# Patient Record
Sex: Female | Born: 1965 | Race: White | Hispanic: No | Marital: Single | Smoking: Never smoker
Health system: Southern US, Community
[De-identification: ages and names within clinical notes are randomized; demographics above are authoritative.]

## PROBLEM LIST (undated history)

## (undated) DIAGNOSIS — I10 Essential (primary) hypertension: Secondary | ICD-10-CM

## (undated) DIAGNOSIS — N200 Calculus of kidney: Secondary | ICD-10-CM

## (undated) DIAGNOSIS — G43909 Migraine, unspecified, not intractable, without status migrainosus: Secondary | ICD-10-CM

## (undated) DIAGNOSIS — F32A Depression, unspecified: Secondary | ICD-10-CM

## (undated) DIAGNOSIS — N83209 Unspecified ovarian cyst, unspecified side: Secondary | ICD-10-CM

## (undated) DIAGNOSIS — F329 Major depressive disorder, single episode, unspecified: Secondary | ICD-10-CM

## (undated) HISTORY — PX: ENDOMETRIAL ABLATION: SHX621

## (undated) HISTORY — DX: Essential (primary) hypertension: I10

## (undated) HISTORY — DX: Depression, unspecified: F32.A

## (undated) HISTORY — DX: Migraine, unspecified, not intractable, without status migrainosus: G43.909

## (undated) HISTORY — PX: CHOLECYSTECTOMY: SHX55

## (undated) HISTORY — DX: Unspecified ovarian cyst, unspecified side: N83.209

## (undated) HISTORY — PX: BARIATRIC SURGERY: SHX1103

## (undated) HISTORY — DX: Major depressive disorder, single episode, unspecified: F32.9

## (undated) HISTORY — DX: Calculus of kidney: N20.0

## (undated) HISTORY — PX: TUBAL LIGATION: SHX77

## (undated) HISTORY — PX: TONSILLECTOMY: SUR1361

---

## 2004-10-26 ENCOUNTER — Emergency Department: Payer: Self-pay | Admitting: Emergency Medicine

## 2008-12-07 ENCOUNTER — Emergency Department: Payer: Self-pay

## 2008-12-17 ENCOUNTER — Emergency Department: Payer: Self-pay | Admitting: Unknown Physician Specialty

## 2010-03-24 ENCOUNTER — Emergency Department: Payer: Self-pay | Admitting: Emergency Medicine

## 2011-11-13 ENCOUNTER — Ambulatory Visit: Payer: Self-pay | Admitting: Obstetrics and Gynecology

## 2012-11-13 ENCOUNTER — Emergency Department: Payer: Self-pay | Admitting: Emergency Medicine

## 2014-05-07 ENCOUNTER — Ambulatory Visit: Payer: Self-pay | Admitting: Neurology

## 2014-05-10 ENCOUNTER — Ambulatory Visit: Payer: Self-pay | Admitting: Neurology

## 2014-09-21 ENCOUNTER — Emergency Department: Payer: Self-pay | Admitting: Emergency Medicine

## 2014-09-21 LAB — COMPREHENSIVE METABOLIC PANEL
ALBUMIN: 3.7 g/dL (ref 3.4–5.0)
Alkaline Phosphatase: 103 U/L (ref 46–116)
Anion Gap: 7 (ref 7–16)
BUN: 16 mg/dL (ref 7–18)
Bilirubin,Total: 0.3 mg/dL (ref 0.2–1.0)
Calcium, Total: 9.2 mg/dL (ref 8.5–10.1)
Chloride: 107 mmol/L (ref 98–107)
Co2: 27 mmol/L (ref 21–32)
Creatinine: 0.88 mg/dL (ref 0.60–1.30)
EGFR (African American): >60
EGFR (Non-African Amer.): >60
Glucose: 124 mg/dL — ABNORMAL HIGH (ref 65–99)
OSMOLALITY: 284 (ref 275–301)
Potassium: 4.1 mmol/L (ref 3.5–5.1)
SGOT(AST): 30 U/L (ref 15–37)
SGPT (ALT): 27 U/L (ref 14–63)
SODIUM: 141 mmol/L (ref 136–145)
Total Protein: 7.5 g/dL (ref 6.4–8.2)

## 2014-09-21 LAB — URINALYSIS, COMPLETE
BILIRUBIN, UR: NEGATIVE
Bacteria: NONE SEEN
Blood: NEGATIVE
Glucose,UR: NEGATIVE mg/dL (ref 0–75)
KETONE: NEGATIVE
Nitrite: NEGATIVE
PH: 7 (ref 4.5–8.0)
PROTEIN: NEGATIVE
RBC,UR: 1 /HPF (ref 0–5)
Specific Gravity: 1.012 (ref 1.003–1.030)
WBC UR: 2 /HPF (ref 0–5)

## 2014-09-21 LAB — CBC WITH DIFFERENTIAL/PLATELET
Basophil #: 0.1 10*3/uL (ref 0.0–0.1)
Basophil %: 1.2 %
Eosinophil #: 0.3 10*3/uL (ref 0.0–0.7)
Eosinophil %: 2.4 %
HCT: 44.4 % (ref 35.0–47.0)
HGB: 14.8 g/dL (ref 12.0–16.0)
Lymphocyte #: 2.7 10*3/uL (ref 1.0–3.6)
Lymphocyte %: 24.9 %
MCH: 29.8 pg (ref 26.0–34.0)
MCHC: 33.3 g/dL (ref 32.0–36.0)
MCV: 89 fL (ref 80–100)
Monocyte #: 1 x10 3/mm — ABNORMAL HIGH (ref 0.2–0.9)
Monocyte %: 8.8 %
Neutrophil #: 6.9 10*3/uL — ABNORMAL HIGH (ref 1.4–6.5)
Neutrophil %: 62.7 %
PLATELETS: 280 10*3/uL (ref 150–440)
RBC: 4.97 10*6/uL (ref 3.80–5.20)
RDW: 13.5 % (ref 11.5–14.5)
WBC: 11 10*3/uL (ref 3.6–11.0)

## 2014-09-21 LAB — LIPASE, BLOOD: LIPASE: 71 U/L — AB (ref 73–393)

## 2014-10-31 ENCOUNTER — Encounter
Admit: 2014-10-31 | Disposition: A | Discharge status: Home / Self Care | Payer: Self-pay | Attending: Neurology | Admitting: Neurology

## 2015-03-26 ENCOUNTER — Other Ambulatory Visit: Payer: Self-pay | Admitting: Specialist

## 2015-03-29 ENCOUNTER — Ambulatory Visit
Admission: RE | Admit: 2015-03-29 | Discharge: 2015-03-29 | Disposition: A | Discharge status: Home / Self Care | Payer: 59 | Source: Ambulatory Visit | Attending: Specialist | Admitting: Specialist

## 2015-03-29 DIAGNOSIS — Z01818 Encounter for other preprocedural examination: Secondary | ICD-10-CM | POA: Insufficient documentation

## 2017-01-05 ENCOUNTER — Encounter: Payer: Self-pay | Admitting: Obstetrics & Gynecology

## 2017-01-05 ENCOUNTER — Ambulatory Visit (INDEPENDENT_AMBULATORY_CARE_PROVIDER_SITE_OTHER): Payer: 59 | Admitting: Obstetrics & Gynecology

## 2017-01-05 VITALS — BP 122/80 | HR 64 | Ht 62.0 in | Wt 141.0 lb

## 2017-01-05 DIAGNOSIS — Z01419 Encounter for gynecological examination (general) (routine) without abnormal findings: Secondary | ICD-10-CM

## 2017-01-05 DIAGNOSIS — Z1231 Encounter for screening mammogram for malignant neoplasm of breast: Secondary | ICD-10-CM | POA: Diagnosis not present

## 2017-01-05 DIAGNOSIS — R232 Flushing: Secondary | ICD-10-CM | POA: Diagnosis not present

## 2017-01-05 DIAGNOSIS — Z87410 Personal history of cervical dysplasia: Secondary | ICD-10-CM | POA: Diagnosis not present

## 2017-01-05 DIAGNOSIS — Z0000 Encounter for general adult medical examination without abnormal findings: Secondary | ICD-10-CM

## 2017-01-05 DIAGNOSIS — Z1239 Encounter for other screening for malignant neoplasm of breast: Secondary | ICD-10-CM

## 2017-01-05 DIAGNOSIS — Z8742 Personal history of other diseases of the female genital tract: Secondary | ICD-10-CM | POA: Diagnosis not present

## 2017-01-05 DIAGNOSIS — Z1211 Encounter for screening for malignant neoplasm of colon: Secondary | ICD-10-CM

## 2017-01-05 MED ORDER — CLONIDINE HCL 0.1 MG/24HR TD PTWK: 0.1000 mg | MEDICATED_PATCH | TRANSDERMAL | 12 refills | Status: DC

## 2017-01-05 NOTE — Patient Instructions (Signed)
Clonidine skin patches  What is this medicine? CLONIDINE (KLOE ni deen) is used to treat high blood pressure. This medicine may be used for other purposes; ask your health care provider or pharmacist if you have questions. COMMON BRAND NAME(S): Catapres-TTS What should I tell my health care provider before I take this medicine? They need to know if you have any of these conditions: -kidney disease -an unusual or allergic reaction to clonidine, other medicines, foods, dyes, or preservatives -pregnant or trying to get pregnant -breast-feeding How should I use this medicine? This medicine is for external use only. Follow the directions on the prescription label. Apply the patch to an area of the upper arm or part of the body that is clean, dry and hairless. Avoid injured, irritated, calloused, or scarred areas. Use a different site each time to prevent skin irritation. Do not cut or trim the patch. One patch should last for 7 days. Do not use your medicine more often than directed. Do not stop using except on the advice of your doctor or health care professional. You must gradually reduce the dose or you may get a dangerous increase in blood pressure. Talk to your pediatrician regarding the use of this medicine in children. Special care may be needed. Overdosage: If you think you have taken too much of this medicine contact a poison control center or emergency room at once. NOTE: This medicine is only for you. Do not share this medicine with others. What if I miss a dose? Replace each patch on the same day of each week, or if the patch falls off. If you do forget to change the patch for two or three days, check with your doctor or health care professional. What may interact with this medicine? Do not take this medicine with any of the following medications: -MAOIs like Carbex, Eldepryl, Marplan, Nardil, and Parnate This medicine may also interact with the following medications: -barbiturate  medicines for inducing sleep or treating seizures like phenobarbital -certain medicines for blood pressure, heart disease, irregular heart beat -certain medicines for depression, anxiety, or psychotic disturbances -prescription pain medicines This list may not describe all possible interactions. Give your health care provider a list of all the medicines, herbs, non-prescription drugs, or dietary supplements you use. Also tell them if you smoke, drink alcohol, or use illegal drugs. Some items may interact with your medicine. What should I watch for while using this medicine? Visit your doctor or health care professional for regular checks on your progress. Check your heart rate and blood pressure regularly while you are using this medicine. Ask your doctor or health care professional what your heart rate should be and when you should contact him or her. You can shower or bathe with the skin patch in position. If the patch gets loose, cover it with the extra adhesive overlay provided. You may get drowsy or dizzy. Do not drive, use machinery, or do anything that needs mental alertness until you know how this medicine affects you. To avoid dizzy or fainting spells, do not stand or sit up quickly, especially if you are an older person. Alcohol can make you more drowsy and dizzy. Avoid alcoholic drinks. Your mouth may get dry. Chewing sugarless gum or sucking hard candy, and drinking plenty of water will help. Do not treat yourself for coughs, colds, or pain while you are using this medicine without asking your doctor or health care professional for advice. Some ingredients may increase your blood pressure. If you are   going to have surgery tell your doctor or health care professional that you are using this medicine. If you are going to have a magnetic resonance imaging (MRI) procedure, tell your MRI technician if you have this patch on your body. It must be removed before a MRI. What side effects may I  notice from receiving this medicine? Side effects that you should report to your doctor or health care professional as soon as possible: -allergic reactions like skin rash, itching or hives, swelling of the face, lips, or tongue -anxiety, nervousness -chest pain -depression -fast, irregular heartbeat -swelling of feet or legs -unusually weak or tired Side effects that usually do not require medical attention (report to your doctor or health care professional if they continue or are bothersome): -change in sex drive or performance -constipation -headache -skin redness, irritation, or darkening under the patch area This list may not describe all possible side effects. Call your doctor for medical advice about side effects. You may report side effects to FDA at 1-800-FDA-1088. Where should I keep my medicine? Keep out of the reach of children. Store at room temperature between 15 and 30 degrees C (59 to 86 degrees F). Throw away any unused medicine after the expiration date. NOTE: This sheet is a summary. It may not cover all possible information. If you have questions about this medicine, talk to your doctor, pharmacist, or health care provider.  2018 Elsevier/Gold Standard (2015-04-22 20:58:28)  

## 2017-01-05 NOTE — Progress Notes (Signed)
HPI:      [name] is a 51 y.o. [id number] who LMP was in the past, she presents today for her annual examination.  The patient has no complaints today. The patient is sexually active. Herlast pap: was normal and prior PAP had POS HPV and last mammogram: was normal.  The patient does perform self breast exams.  There is no notable family history of breast or ovarian cancer in her family. The patient is not taking hormone replacement therapy. Patient denies post-menopausal vaginal bleeding.   The patient has regular exercise: yes. The patient denies current symptoms of depression.    Weight loss surgery last year, lost 150 lbs Ablation 2013, no period since. Hot flashes bothersome, started 6 mos ago.  Night sweats too.  Insomnia. Righ Lowerr Quadrant pain at times, has been told she has a cyst.  GYN Hx: Last Colonoscopy:never ago.  Last DEXA: never ago.    PMHx: History reviewed. No pertinent past medical history. Past Surgical History:  Procedure Laterality Date  . BARIATRIC SURGERY    . CHOLECYSTECTOMY    . ENDOMETRIAL ABLATION    . TONSILLECTOMY    . TUBAL LIGATION     Family History  Problem Relation Age of Onset  . Kidney cancer Brother   . Lung cancer Maternal Aunt   . Uterine cancer Maternal Aunt   . Lung cancer Maternal Uncle   . Lung cancer Maternal Grandmother   . Esophageal cancer Maternal Grandmother   . Diabetes Maternal Grandmother   . Colon cancer Paternal Grandmother   . Diabetes Father   . Hypertension Father   . Diabetes Maternal Grandfather    Social History  Substance Use Topics  . Smoking status: Never Smoker  . Smokeless tobacco: Never Used  . Alcohol use No    Current Outpatient Prescriptions:  .  traMADol (ULTRAM) 50 MG tablet, Take by mouth., Disp: , Rfl:  Allergies: Penicillins  Review of Systems  Constitutional: Negative for chills, fever and malaise/fatigue.  HENT: Negative for congestion, sinus pain and sore throat.   Eyes:  Negative for blurred vision and pain.  Respiratory: Negative for cough and wheezing.   Cardiovascular: Negative for chest pain and leg swelling.  Gastrointestinal: Negative for abdominal pain, constipation, diarrhea, heartburn, nausea and vomiting.  Genitourinary: Negative for dysuria, frequency, hematuria and urgency.  Musculoskeletal: Negative for back pain, joint pain, myalgias and neck pain.  Skin: Negative for itching and rash.  Neurological: Negative for dizziness, tremors and weakness.  Endo/Heme/Allergies: Does not bruise/bleed easily.  Psychiatric/Behavioral: Negative for depression. The patient is not nervous/anxious and does not have insomnia.     Objective: BP 122/80   Pulse 64   Ht 5\' 2"  (1.575 m)   Wt 141 lb (64 kg)   BMI 25.79 kg/m   Filed Weights   01/05/17 1515  Weight: 141 lb (64 kg)   Body mass index is 25.79 kg/m. Physical Exam  Constitutional: She is oriented to person, place, and time. She appears well-developed and well-nourished. No distress.  Genitourinary: Rectum normal, vagina normal and uterus normal. Pelvic exam was performed with patient supine. There is no rash or lesion on the right labia. There is no rash or lesion on the left labia. Vagina exhibits no lesion. No bleeding in the vagina.  Right adnexum displays fullness. Right adnexum does not display mass and does not display tenderness. Left adnexum does not display mass and does not display tenderness. Cervix does not exhibit motion tenderness,  lesion, friability or polyp.   Uterus is mobile and midaxial. Uterus is not enlarged or exhibiting a mass.  HENT:  Head: Normocephalic and atraumatic. Head is without laceration.  Right Ear: Hearing normal.  Left Ear: Hearing normal.  Nose: No epistaxis.  No foreign bodies.  Mouth/Throat: Uvula is midline, oropharynx is clear and moist and mucous membranes are normal.  Eyes: Pupils are equal, round, and reactive to light.  Neck: Normal range of motion.  Neck supple. No thyromegaly present.  Cardiovascular: Normal rate and regular rhythm.  Exam reveals no gallop and no friction rub.   No murmur heard. Pulmonary/Chest: Effort normal and breath sounds normal. No respiratory distress. She has no wheezes. Right breast exhibits no mass, no skin change and no tenderness. Left breast exhibits no mass, no skin change and no tenderness.  Abdominal: Soft. Bowel sounds are normal. She exhibits no distension. There is no tenderness. There is no rebound.  Musculoskeletal: Normal range of motion.  Neurological: She is alert and oriented to person, place, and time. No cranial nerve deficit.  Skin: Skin is warm and dry.  Psychiatric: She has a normal mood and affect. Judgment normal.  Vitals reviewed.   Assessment: Annual Exam 1. Annual physical exam   2. Screening for breast cancer   3. Screen for colon cancer   4. Vasomotor flushing   5. History of cervical dysplasia   6. History of ovarian cyst     Plan:            1.  Cervical Screening-  Pap smear done today  2. Breast screening- Exam annually and mammogram scheduled  3. Colonoscopy every 10 years, Hemoccult testing after age 25  4. Labs managed by PCP  5. Counseling for hormonal therapy: none Plan Clonidine patch therapy and continued exercise.              6. RLQ pain, [place].    F/U  Return in about 1 week (around 01/12/2017) for Follow up with GYN [place].  Annamarie Major, MD, [facility], [facility] 01/05/2017  3:51 PM

## 2017-01-07 LAB — IGP, APTIMA HPV
HPV APTIMA: NEGATIVE
PAP Smear Comment: 0

## 2017-01-20 ENCOUNTER — Ambulatory Visit (INDEPENDENT_AMBULATORY_CARE_PROVIDER_SITE_OTHER): Payer: 59 | Admitting: Obstetrics & Gynecology

## 2017-01-20 ENCOUNTER — Encounter: Payer: Self-pay | Admitting: Obstetrics & Gynecology

## 2017-01-20 ENCOUNTER — Ambulatory Visit (INDEPENDENT_AMBULATORY_CARE_PROVIDER_SITE_OTHER): Payer: 59

## 2017-01-20 VITALS — BP 120/80 | HR 59 | Ht 61.0 in | Wt 140.0 lb

## 2017-01-20 DIAGNOSIS — R1031 Right lower quadrant pain: Secondary | ICD-10-CM

## 2017-01-20 DIAGNOSIS — Z8742 Personal history of other diseases of the female genital tract: Secondary | ICD-10-CM | POA: Diagnosis not present

## 2017-01-20 DIAGNOSIS — N8300 Follicular cyst of ovary, unspecified side: Secondary | ICD-10-CM

## 2017-01-20 NOTE — Progress Notes (Signed)
  HPI: Pt has had some RLQ pain, none now.  Prior h/o right ovarian cyst.  No bleeding since ablation.  Ultrasound demonstrates no masses seen, follicles on each ovary These findings are Pelvis normal  PMHx: She  has no past medical history on file. Also,  has a past surgical history that includes Bariatric Surgery; Tubal ligation; Tonsillectomy; Endometrial ablation; and Cholecystectomy., family history includes Colon cancer in her paternal grandmother; Diabetes in her father, maternal grandfather, and maternal grandmother; Esophageal cancer in her maternal grandmother; Hypertension in her father; Kidney cancer in her brother; Lung cancer in her maternal aunt, maternal grandmother, and maternal uncle; Uterine cancer in her maternal aunt.,  reports that she has never smoked. She has never used smokeless tobacco. She reports that she does not drink alcohol or use drugs.  She has a current medication list which includes the following prescription(s): clonidine and tramadol. Also, is allergic to penicillins.  Review of Systems  All other systems reviewed and are negative.   Objective: BP 120/80   Pulse (!) 59   Ht 5\' 1"  (1.549 m)   Wt 140 lb (63.5 kg)   BMI 26.45 kg/m   Physical examination Constitutional NAD, Conversant  Skin No rashes, lesions or ulceration.   Extremities: Moves all appropriately.  Normal ROM for age. No lymphadenopathy.  Neuro: Grossly intact  Psych: Oriented to PPT.  Normal mood. Normal affect.   Assessment:  Right lower quadrant pain  Ovarian cyst, follicular Monitor for pain A total of 15 minutes were spent face-to-face with the patient during this encounter and over half of that time dealt with counseling and coordination of care.  Annamarie MajorPaul Andromeda Poppen, MD, [facility] FrederickFACOG [facility], [facility] 01/20/2017  3:17 PM

## 2017-01-26 ENCOUNTER — Ambulatory Visit: Payer: 59 | Admitting: General Surgery

## 2017-01-26 ENCOUNTER — Telehealth: Payer: Self-pay | Admitting: General Surgery

## 2017-01-26 NOTE — Telephone Encounter (Signed)
Left message for patient to call & schedule an appointment with Dr Corrie DandyByrnett(Old Pt(last seen 07/01/95) Ref. Velora Mediateobert Harris, [facility]

## 2017-02-10 ENCOUNTER — Ambulatory Visit
Admission: RE | Admit: 2017-02-10 | Discharge: 2017-02-10 | Disposition: A | Discharge status: Home / Self Care | Payer: 59 | Source: Ambulatory Visit | Attending: Obstetrics & Gynecology | Admitting: Obstetrics & Gynecology

## 2017-02-10 DIAGNOSIS — Z1239 Encounter for other screening for malignant neoplasm of breast: Secondary | ICD-10-CM

## 2017-02-10 DIAGNOSIS — Z1231 Encounter for screening mammogram for malignant neoplasm of breast: Secondary | ICD-10-CM | POA: Insufficient documentation

## 2017-02-16 ENCOUNTER — Other Ambulatory Visit: Payer: Self-pay | Admitting: *Deleted

## 2017-02-16 ENCOUNTER — Inpatient Hospital Stay
Admission: RE | Admit: 2017-02-16 | Discharge: 2017-02-16 | Disposition: A | Discharge status: Home / Self Care | Payer: Self-pay | Source: Ambulatory Visit | Attending: *Deleted | Admitting: *Deleted

## 2017-02-16 DIAGNOSIS — Z9289 Personal history of other medical treatment: Secondary | ICD-10-CM

## 2017-03-09 ENCOUNTER — Encounter: Payer: Self-pay | Admitting: *Deleted

## 2017-03-24 ENCOUNTER — Telehealth: Payer: Self-pay | Admitting: General Surgery

## 2017-03-24 NOTE — Telephone Encounter (Signed)
Left message asking patient to call back and schedule appointment.See referral work-q.

## 2017-03-26 ENCOUNTER — Telehealth: Payer: Self-pay | Admitting: General Surgery

## 2017-03-26 NOTE — Telephone Encounter (Signed)
LEFT MESSAGE FOR PATIENT TO CALL & SCHEDULE APPOINTMENT.PLEASE SEE WORK-Q FOR MORE INFORMATION.

## 2017-04-01 ENCOUNTER — Encounter: Payer: Self-pay | Admitting: General Surgery

## 2018-01-05 ENCOUNTER — Encounter: Payer: Self-pay | Admitting: Obstetrics & Gynecology

## 2018-01-06 ENCOUNTER — Encounter: Payer: Self-pay | Admitting: Obstetrics & Gynecology

## 2018-01-06 ENCOUNTER — Ambulatory Visit (INDEPENDENT_AMBULATORY_CARE_PROVIDER_SITE_OTHER): Payer: 59 | Admitting: Obstetrics & Gynecology

## 2018-01-06 VITALS — BP 100/70 | HR 66 | Ht 62.0 in | Wt 144.0 lb

## 2018-01-06 DIAGNOSIS — Z1211 Encounter for screening for malignant neoplasm of colon: Secondary | ICD-10-CM | POA: Diagnosis not present

## 2018-01-06 DIAGNOSIS — Z1239 Encounter for other screening for malignant neoplasm of breast: Secondary | ICD-10-CM

## 2018-01-06 DIAGNOSIS — Z1231 Encounter for screening mammogram for malignant neoplasm of breast: Secondary | ICD-10-CM

## 2018-01-06 DIAGNOSIS — Z0000 Encounter for general adult medical examination without abnormal findings: Secondary | ICD-10-CM | POA: Diagnosis not present

## 2018-01-06 DIAGNOSIS — Z8619 Personal history of other infectious and parasitic diseases: Secondary | ICD-10-CM | POA: Diagnosis not present

## 2018-01-06 MED ORDER — CLONIDINE HCL 0.2 MG PO TABS: 0.2000 mg | ORAL_TABLET | Freq: Two times a day (BID) | ORAL | 11 refills | Status: DC

## 2018-01-06 NOTE — Progress Notes (Signed)
HPI:      [name] is a 52 y.o. [id number] who LMP was in the past as she had ABLATION 2013, she presents today for her annual examination.  The patient has no complaints today other than hot flashes and night sweats more so now; did not tolerate patch well.. The patient is sexually active. Her last pap:  prior HPV POS, and then 2018 and was normal and last mammogram: approximate date 2018 and was normal.  The patient does perform self breast exams.  There is no notable family history of breast or ovarian cancer in her family. The patient is not taking hormone replacement therapy. Patient denies post-menopausal vaginal bleeding.   The patient has regular exercise: yes. The patient denies current symptoms of depression. Occas right sided pain that she had prior (cyst years ago), mild, not often.   GYN Hx: Last Colonoscopy:never ago. Normal.  Last DEXA: never ago.    PMHx: Past Medical History:  Diagnosis Date  . Calculus of kidney   . Depression   . Hypertension   . Migraine   . Ovarian cyst    Past Surgical History:  Procedure Laterality Date  . BARIATRIC SURGERY    . CHOLECYSTECTOMY    . ENDOMETRIAL ABLATION    . TONSILLECTOMY    . TUBAL LIGATION     Family History  Problem Relation Age of Onset  . Kidney cancer Brother   . Lung cancer Maternal Aunt   . Uterine cancer Maternal Aunt   . Lung cancer Maternal Uncle   . Lung cancer Maternal Grandmother   . Esophageal cancer Maternal Grandmother   . Diabetes Maternal Grandmother   . Colon cancer Paternal Grandmother   . Diabetes Father   . Hypertension Father   . Diabetes Maternal Grandfather   . Breast cancer Neg Hx    Social History   Tobacco Use  . Smoking status: Never Smoker  . Smokeless tobacco: Never Used  Substance Use Topics  . Alcohol use: No  . Drug use: No    Current Outpatient Medications:  .  traMADol (ULTRAM) 50 MG tablet, Take by mouth., Disp: , Rfl:  .  clonazePAM (KLONOPIN) 0.5 MG tablet,  Take by mouth., Disp: , Rfl:  .  cloNIDine (CATAPRES) 0.2 MG tablet, Take 1 tablet (0.2 mg total) by mouth 2 (two) times daily., Disp: 30 tablet, Rfl: 11 Allergies: Penicillins  Review of Systems  Constitutional: Negative for chills, fever and malaise/fatigue.  HENT: Negative for congestion, sinus pain and sore throat.   Eyes: Negative for blurred vision and pain.  Respiratory: Negative for cough and wheezing.   Cardiovascular: Negative for chest pain and leg swelling.  Gastrointestinal: Negative for abdominal pain, constipation, diarrhea, heartburn, nausea and vomiting.  Genitourinary: Negative for dysuria, frequency, hematuria and urgency.  Musculoskeletal: Negative for back pain, joint pain, myalgias and neck pain.  Skin: Negative for itching and rash.  Neurological: Negative for dizziness, tremors and weakness.  Endo/Heme/Allergies: Does not bruise/bleed easily.  Psychiatric/Behavioral: Negative for depression. The patient is not nervous/anxious and does not have insomnia.     Objective: BP 100/70   Pulse 66   Ht  (1.575 m)   Wt 144 lb (65.3 kg)   BMI 26.34 kg/m   Filed Weights   01/06/18 0815  Weight: 144 lb (65.3 kg)   Body mass index is 26.34 kg/m. Physical Exam  Constitutional: She is oriented to person, place, and time. She appears well-developed and well-nourished. No  distress.  Genitourinary: Rectum normal, vagina normal and uterus normal. Pelvic exam was performed with patient supine. There is no rash or lesion on the right labia. There is no rash or lesion on the left labia. Vagina exhibits no lesion. No bleeding in the vagina. Right adnexum does not display mass and does not display tenderness. Left adnexum does not display mass and does not display tenderness. Cervix does not exhibit motion tenderness, lesion, friability or polyp.   Uterus is mobile and midaxial. Uterus is not enlarged or exhibiting a mass.  HENT:  Head: Normocephalic and atraumatic. Head is  without laceration.  Right Ear: Hearing normal.  Left Ear: Hearing normal.  Nose: No epistaxis.  No foreign bodies.  Mouth/Throat: Uvula is midline, oropharynx is clear and moist and mucous membranes are normal.  Eyes: Pupils are equal, round, and reactive to light.  Neck: Normal range of motion. Neck supple. No thyromegaly present.  Cardiovascular: Normal rate and regular rhythm. Exam reveals no gallop and no friction rub.  No murmur heard. Pulmonary/Chest: Effort normal and breath sounds normal. No respiratory distress. She has no wheezes. Right breast exhibits no mass, no skin change and no tenderness. Left breast exhibits no mass, no skin change and no tenderness.  Abdominal: Soft. Bowel sounds are normal. She exhibits no distension. There is no tenderness. There is no rebound.  Musculoskeletal: Normal range of motion.  Neurological: She is alert and oriented to person, place, and time. No cranial nerve deficit.  Skin: Skin is warm and dry.  Psychiatric: She has a normal mood and affect. Judgment normal.  Vitals reviewed.  Assessment: Annual Exam 1. Annual physical exam   2. Screening breast examination   3. Screen for colon cancer   4. History of HPV infection    Plan:            1.  Cervical Screening-  Pap smear done today  2. Breast screening- Exam annually and mammogram scheduled  3. Colonoscopy every 10 years, Hemoccult testing after age 7 Sch referral soon.  4. Labs managed by PCP  5. Counseling for hormonal therapy: none.  Will Rx Clonidine pill therapy for hot flashes and night sweats, as did not tolerate patch well.  HRT also discussed as alternative option.    F/U  Return in about 1 year (around 01/07/2019) for Annual.  Annamarie Major, MD, [facility], [facility] 01/06/2018  8:48 AM

## 2018-01-06 NOTE — Patient Instructions (Signed)
PAP every year Mammogram every year    Call [phone] to schedule at [facility] Colonoscopy every 10 years - to arrange referral Labs yearly (with PCP)

## 2018-01-09 LAB — PAP IG (IMAGE GUIDED): PAP Smear Comment: 0

## 2018-04-13 ENCOUNTER — Telehealth: Payer: Self-pay

## 2018-04-13 NOTE — Telephone Encounter (Signed)
Pt aware she can call and make appointment for mammo,

## 2018-07-19 ENCOUNTER — Telehealth: Payer: Self-pay

## 2018-07-19 NOTE — Telephone Encounter (Signed)
-----   Message from Nadara Mustardobert P Harris, MD sent at 07/19/2018 10:27 AM EST ----- Regarding: MMG Received notice she has not received MMG yet as ordered at her Annual. Please check and encourage her to do this, and document conversation.

## 2018-07-19 NOTE — Telephone Encounter (Signed)
Called pt to remind her to get mammo, unable to leave message

## 2018-08-08 ENCOUNTER — Telehealth: Payer: Self-pay

## 2018-08-08 NOTE — Telephone Encounter (Signed)
Called pt to remind her to schedule mammo, she states she will get it in January.

## 2018-08-08 NOTE — Telephone Encounter (Signed)
-----   Message from Robert P Harris, MD sent at 07/19/2018 10:27 AM EST ----- Regarding: MMG Received notice she has not received MMG yet as ordered at her Annual. Please check and encourage her to do this, and document conversation.  

## 2019-01-09 ENCOUNTER — Encounter: Payer: Self-pay | Admitting: Obstetrics & Gynecology

## 2019-01-09 ENCOUNTER — Ambulatory Visit (INDEPENDENT_AMBULATORY_CARE_PROVIDER_SITE_OTHER): Payer: 59 | Admitting: Obstetrics & Gynecology

## 2019-01-09 ENCOUNTER — Other Ambulatory Visit: Payer: Self-pay

## 2019-01-09 VITALS — BP 128/82 | Ht 61.0 in | Wt 142.0 lb

## 2019-01-09 DIAGNOSIS — R300 Dysuria: Secondary | ICD-10-CM | POA: Diagnosis not present

## 2019-01-09 DIAGNOSIS — M545 Low back pain, unspecified: Secondary | ICD-10-CM

## 2019-01-09 DIAGNOSIS — Z124 Encounter for screening for malignant neoplasm of cervix: Secondary | ICD-10-CM

## 2019-01-09 DIAGNOSIS — Z01419 Encounter for gynecological examination (general) (routine) without abnormal findings: Secondary | ICD-10-CM | POA: Diagnosis not present

## 2019-01-09 DIAGNOSIS — G8929 Other chronic pain: Secondary | ICD-10-CM

## 2019-01-09 DIAGNOSIS — Z1239 Encounter for other screening for malignant neoplasm of breast: Secondary | ICD-10-CM

## 2019-01-09 DIAGNOSIS — N83201 Unspecified ovarian cyst, right side: Secondary | ICD-10-CM

## 2019-01-09 LAB — POCT URINALYSIS DIPSTICK
Bilirubin, UA: NEGATIVE
Blood, UA: POSITIVE
Glucose, UA: NEGATIVE
Ketones, UA: NEGATIVE
Leukocytes, UA: NEGATIVE
Nitrite, UA: POSITIVE
Protein, UA: NEGATIVE
Spec Grav, UA: 1.01 (ref 1.010–1.025)
Urobilinogen, UA: 0.2 E.U./dL
pH, UA: 5 (ref 5.0–8.0)

## 2019-01-09 MED ORDER — CLONIDINE HCL 0.2 MG PO TABS: 0.2000 mg | ORAL_TABLET | Freq: Every day | ORAL | 3 refills | Status: DC

## 2019-01-09 NOTE — Progress Notes (Signed)
HPI:      [name] is a 53 y.o. [id number] who LMP was in the past, she presents today for her annual examination.  The patient has no complaints today. The patient is sexually active. Herlast pap: approximate date 2019 and was normal and prior HPV PAP 2017; and last mammogram: approximate date 2018 and was normal.  The patient does perform self breast exams.  There is no notable family history of breast or ovarian cancer in her family. The patient is not taking hormone replacement therapy.  Takes CLONIDINE for hot flashes, some help, now taking 0.2mg  once a day (sidee effects of dizziness with BID dosing). Patient denies post-menopausal vaginal bleeding.   The patient has regular exercise: yes. The patient denies current symptoms of depression.    Pt has some right back pain sometimes into lower quadrant, no assoc sx's or context or modifiers.  Prior h/o stone, cyst in distant past  GYN Hx: Last Colonoscopy:none.  Cologuard 3 months ago. Normal.  Last DEXA: never ago.    PMHx: Past Medical History:  Diagnosis Date  . Calculus of kidney   . Depression   . Hypertension   . Migraine   . Ovarian cyst    Past Surgical History:  Procedure Laterality Date  . BARIATRIC SURGERY    . CHOLECYSTECTOMY    . ENDOMETRIAL ABLATION    . TONSILLECTOMY    . TUBAL LIGATION     Family History  Problem Relation Age of Onset  . Kidney cancer Brother   . Lung cancer Maternal Aunt   . Uterine cancer Maternal Aunt   . Lung cancer Maternal Uncle   . Lung cancer Maternal Grandmother   . Esophageal cancer Maternal Grandmother   . Diabetes Maternal Grandmother   . Colon cancer Paternal Grandmother   . Diabetes Father   . Hypertension Father   . Diabetes Maternal Grandfather   . Breast cancer Neg Hx    Social History   Tobacco Use  . Smoking status: Never Smoker  . Smokeless tobacco: Never Used  Substance Use Topics  . Alcohol use: No  . Drug use: No    Current Outpatient Medications:   .  clonazePAM (KLONOPIN) 0.5 MG tablet, Take by mouth., Disp: , Rfl:  .  cloNIDine (CATAPRES) 0.2 MG tablet, Take 1 tablet (0.2 mg total) by mouth daily., Disp: 90 tablet, Rfl: 3 .  traMADol (ULTRAM) 50 MG tablet, Take by mouth., Disp: , Rfl:  Allergies: Penicillins  Review of Systems  Constitutional: Negative for chills, fever and malaise/fatigue.  HENT: Negative for congestion, sinus pain and sore throat.   Eyes: Negative for blurred vision and pain.  Respiratory: Negative for cough and wheezing.   Cardiovascular: Negative for chest pain and leg swelling.  Gastrointestinal: Negative for abdominal pain, constipation, diarrhea, heartburn, nausea and vomiting.  Genitourinary: Negative for dysuria, frequency, hematuria and urgency.  Musculoskeletal: Negative for back pain, joint pain, myalgias and neck pain.  Skin: Negative for itching and rash.  Neurological: Negative for dizziness, tremors and weakness.  Endo/Heme/Allergies: Does not bruise/bleed easily.  Psychiatric/Behavioral: Negative for depression. The patient is not nervous/anxious and does not have insomnia.     Objective: BP 128/82   Ht 5\' 1"  (1.549 m)   Wt 142 lb (64.4 kg)   BMI 26.83 kg/m   Filed Weights   01/09/19 0807  Weight: 142 lb (64.4 kg)   Body mass index is 26.83 kg/m. Physical Exam Constitutional:  General: She is not in acute distress.    Appearance: She is well-developed.  Genitourinary:     Pelvic exam was performed with patient supine.     Vagina, uterus and rectum normal.     No lesions in the vagina.     No vaginal bleeding.     No cervical motion tenderness, friability, lesion or polyp.     Uterus is mobile.     Uterus is not enlarged.     No uterine mass detected.    Uterus is midaxial.     Right adnexal mass present.     No left adnexal mass present.     Right adnexa tender.     Left adnexa not tender.  HENT:     Head: Normocephalic and atraumatic. No laceration.     Right Ear:  Hearing normal.     Left Ear: Hearing normal.     Mouth/Throat:     Pharynx: Uvula midline.  Eyes:     Pupils: Pupils are equal, round, and reactive to light.  Neck:     Musculoskeletal: Normal range of motion and neck supple.     Thyroid: No thyromegaly.  Cardiovascular:     Rate and Rhythm: Normal rate and regular rhythm.     Heart sounds: No murmur. No friction rub. No gallop.   Pulmonary:     Effort: Pulmonary effort is normal. No respiratory distress.     Breath sounds: Normal breath sounds. No wheezing.  Chest:     Breasts:        Right: No mass, skin change or tenderness.        Left: No mass, skin change or tenderness.  Abdominal:     General: Bowel sounds are normal. There is no distension.     Palpations: Abdomen is soft.     Tenderness: There is no abdominal tenderness. There is no rebound.  Musculoskeletal: Normal range of motion.  Neurological:     Mental Status: She is alert and oriented to person, place, and time.     Cranial Nerves: No cranial nerve deficit.  Skin:    General: Skin is warm and dry.  Psychiatric:        Judgment: Judgment normal.  Vitals signs reviewed.    UA + RBC and Nitrate   Assessment: Annual Exam 1. Women's annual routine gynecological examination   2. Screening breast examination   3. Screening for cervical cancer   4. Chronic right-sided low back pain without sciatica   5. Right ovarian cyst     Plan:            1.  Cervical Screening-  Pap smear done today  2. Breast screening- Exam annually and mammogram scheduled  3. Colonoscopy every 10 years, Hemoccult testing after age 53  4. Labs managed by PCP  5. Counseling for hormonal therapy: none Cont Clonidine 0.2mg  once daily for hot flashes  6. Right back pain, lower quadrant as well. Prior h/o kidney stones, also cyst.  Cyst on exam today. Plan Gyn [place] to assess. U Culture for urine infection testing.              7. FRAX - FRAX score for assessing the 10 year  probability for fracture calculated and discussed today.  Based on age and score today, DEXA is not currently scheduled.     F/U  Return in about 1 year (around 01/09/2020) for Annual.  Annamarie Major, MD, [facility], [facility] 01/09/2019  8:33 AM

## 2019-01-09 NOTE — Patient Instructions (Signed)
PAP every three years Mammogram every year    Call [phone] to schedule at [place] Colonoscopy every 10 years- need soon Labs yearly (with PCP)   

## 2019-01-11 ENCOUNTER — Encounter: Payer: Self-pay | Admitting: Emergency Medicine

## 2019-01-11 ENCOUNTER — Other Ambulatory Visit: Payer: Self-pay

## 2019-01-11 ENCOUNTER — Emergency Department
Admission: EM | Admit: 2019-01-11 | Discharge: 2019-01-12 | Disposition: A | Discharge status: Home / Self Care | Payer: 59 | Attending: Emergency Medicine | Admitting: Emergency Medicine

## 2019-01-11 DIAGNOSIS — R102 Pelvic and perineal pain unspecified side: Secondary | ICD-10-CM

## 2019-01-11 DIAGNOSIS — Z79899 Other long term (current) drug therapy: Secondary | ICD-10-CM | POA: Diagnosis not present

## 2019-01-11 DIAGNOSIS — R109 Unspecified abdominal pain: Secondary | ICD-10-CM

## 2019-01-11 DIAGNOSIS — N3000 Acute cystitis without hematuria: Secondary | ICD-10-CM

## 2019-01-11 DIAGNOSIS — R1031 Right lower quadrant pain: Secondary | ICD-10-CM | POA: Insufficient documentation

## 2019-01-11 DIAGNOSIS — I10 Essential (primary) hypertension: Secondary | ICD-10-CM | POA: Diagnosis not present

## 2019-01-11 DIAGNOSIS — R10A1 Flank pain, right side: Secondary | ICD-10-CM

## 2019-01-11 LAB — URINALYSIS, COMPLETE (UACMP) WITH MICROSCOPIC
Bacteria, UA: NONE SEEN
Bilirubin Urine: NEGATIVE
Glucose, UA: NEGATIVE mg/dL
Hgb urine dipstick: NEGATIVE
Ketones, ur: NEGATIVE mg/dL
Leukocytes,Ua: NEGATIVE
Nitrite: NEGATIVE
Protein, ur: NEGATIVE mg/dL
Specific Gravity, Urine: 1.001 — ABNORMAL LOW (ref 1.005–1.030)
Squamous Epithelial / LPF: NONE SEEN (ref 0–5)
pH: 6 (ref 5.0–8.0)

## 2019-01-11 LAB — BASIC METABOLIC PANEL
Anion gap: 8 (ref 5–15)
BUN: 15 mg/dL (ref 6–20)
CO2: 28 mmol/L (ref 22–32)
Calcium: 9.4 mg/dL (ref 8.9–10.3)
Chloride: 108 mmol/L (ref 98–111)
Creatinine, Ser: 0.66 mg/dL (ref 0.44–1.00)
GFR calc Af Amer: >60 mL/min (ref >60)
GFR calc non Af Amer: >60 mL/min (ref >60)
Glucose, Bld: 90 mg/dL (ref 70–99)
Potassium: 4 mmol/L (ref 3.5–5.1)
Sodium: 144 mmol/L (ref 135–145)

## 2019-01-11 LAB — URINE CULTURE

## 2019-01-11 LAB — CBC
HCT: 47.5 % — ABNORMAL HIGH (ref 36.0–46.0)
Hemoglobin: 16 g/dL — ABNORMAL HIGH (ref 12.0–15.0)
MCH: 31.7 pg (ref 26.0–34.0)
MCHC: 33.7 g/dL (ref 30.0–36.0)
MCV: 94.1 fL (ref 80.0–100.0)
Platelets: 341 10*3/uL (ref 150–400)
RBC: 5.05 MIL/uL (ref 3.87–5.11)
RDW: 13.4 % (ref 11.5–15.5)
WBC: 9.5 10*3/uL (ref 4.0–10.5)
nRBC: 0 % (ref 0.0–0.2)

## 2019-01-11 LAB — POCT PREGNANCY, URINE: Preg Test, Ur: NEGATIVE

## 2019-01-11 NOTE — ED Triage Notes (Signed)
Patient presents to the ED with right flank pain x 2 weeks.  Patient states she is also having pelvic pain x 2 weeks as well.  Patient states her PCP felt her ovaries during a pelvic exam and told her he felt a cyst.  Patient states at [facility] they told her they can't do an ultrasound until June 5th.  Patient states, "The pain is getting worse, it's so bad, I just can't wait."

## 2019-01-12 ENCOUNTER — Emergency Department: Payer: 59

## 2019-01-12 LAB — CYTOLOGY - PAP: HPV: NOT DETECTED

## 2019-01-12 MED ORDER — TRAMADOL HCL 50 MG PO TABS: 50.0000 mg | ORAL_TABLET | Freq: Four times a day (QID) | ORAL | 0 refills | Status: AC | PRN

## 2019-01-12 MED ORDER — CEPHALEXIN 500 MG PO CAPS: 500.0000 mg | ORAL_CAPSULE | Freq: Two times a day (BID) | ORAL | 0 refills | Status: AC

## 2019-01-12 MED ORDER — CEPHALEXIN 500 MG PO CAPS
500.0000 mg | ORAL_CAPSULE | Freq: Once | ORAL | Status: AC
  Administered 2019-01-12: 05:00:00 500 mg via ORAL
  Filled 2019-01-12: qty 1

## 2019-01-12 MED ORDER — MORPHINE SULFATE (PF) 4 MG/ML IV SOLN
4.0000 mg | Freq: Once | INTRAVENOUS | Status: AC
  Administered 2019-01-12: 4 mg via INTRAVENOUS
  Filled 2019-01-12: qty 1

## 2019-01-12 MED ORDER — ONDANSETRON HCL 4 MG/2ML IJ SOLN
4.0000 mg | Freq: Once | INTRAMUSCULAR | Status: AC
  Administered 2019-01-12: 4 mg via INTRAVENOUS
  Filled 2019-01-12: qty 2

## 2019-01-12 NOTE — ED Notes (Addendum)
Pt uprite on stretcher in exam room with no distress noted, mask in place; Pt reports mid lower abd pain and rt flank pain accomp by nausea; went for yearly exam with Dr Tiburcio Pea who rec pelvic u/s to r/o ovarian cyst; also reports hx kidney stone with stent placement; +BS, abd soft/nondist, tender to rt lower abd

## 2019-01-12 NOTE — Progress Notes (Signed)
Pt has been Rx Keflex for UTI    Also has suggestion of kidney stone by CT yesterday Pelvic US normal PAP LGSIL    Plan repeat PAP 3-4 mos    Vs Colpo/Biopsies    Discussed, will repeat PAP and follow conservatively as she has good compliance and has had negative biopsies in past for low grade PAP's.  Annamarie Major, MD, [facility], [facility] 01/12/2019  9:55 AM

## 2019-01-12 NOTE — ED Notes (Signed)
Pt returned to room; st feeling relief of CP now

## 2019-01-12 NOTE — ED Notes (Signed)
Pt to u/s via stretcher accomp by u/s tech 

## 2019-01-12 NOTE — ED Notes (Signed)
Pt anxious, reports onset mid chest pressure, increasing with deep breathing; st "feels like I can't get a deep breathe"; pt placed on card monitor and EKG performed; Dr Manson Passey notified; no further orders given at this time

## 2019-01-12 NOTE — ED Notes (Signed)
Pt to CT via stretcher accomp by CT tech 

## 2019-01-12 NOTE — ED Provider Notes (Signed)
[facility] Emergency Department Provider Note    First MD Initiated Contact with Patient 01/11/19 2356     (approximate)  I have reviewed the triage vital signs and the nursing notes.   HISTORY  Chief Complaint Flank Pain    HPI VALERA VALLAS is a 53 y.o. female with below list of previous medical conditions including kidney stone and ovarian cyst presents to the emergency department secondary to 9 out of 10  right lower quadrant/right flank pain x2 weeks with associated nausea at present.  Patient denies any vomiting or diarrhea.  Patient denies any urinary symptoms.        Past Medical History:  Diagnosis Date  . Calculus of kidney   . Depression   . Hypertension   . Migraine   . Ovarian cyst     There are no active problems to display for this patient.   Past Surgical History:  Procedure Laterality Date  . BARIATRIC SURGERY    . CHOLECYSTECTOMY    . ENDOMETRIAL ABLATION    . TONSILLECTOMY    . TUBAL LIGATION      Prior to Admission medications   Medication Sig Start Date End Date Taking? Authorizing Provider  cephALEXin (KEFLEX) 500 MG capsule Take 1 capsule (500 mg total) by mouth 2 (two) times daily for 7 days. 01/12/19 01/19/19  Darci Current, MD  clonazePAM (KLONOPIN) 0.5 MG tablet Take by mouth.    [provider]  cloNIDine (CATAPRES) 0.2 MG tablet Take 1 tablet (0.2 mg total) by mouth daily. 01/09/19   Nadara Mustard, MD  traMADol (ULTRAM) 50 MG tablet Take by mouth.    [provider]  traMADol (ULTRAM) 50 MG tablet Take 1 tablet (50 mg total) by mouth every 6 (six) hours as needed. 01/12/19 01/12/20  Darci Current, MD    Allergies Penicillins  Family History  Problem Relation Age of Onset  . Kidney cancer Brother   . Lung cancer Maternal Aunt   . Uterine cancer Maternal Aunt   . Lung cancer Maternal Uncle   . Lung cancer Maternal Grandmother   . Esophageal cancer Maternal Grandmother   .  Diabetes Maternal Grandmother   . Colon cancer Paternal Grandmother   . Diabetes Father   . Hypertension Father   . Diabetes Maternal Grandfather   . Breast cancer Neg Hx     Social History Social History   Tobacco Use  . Smoking status: Never Smoker  . Smokeless tobacco: Never Used  Substance Use Topics  . Alcohol use: No  . Drug use: No    Review of Systems Constitutional: No fever/chills Eyes: No visual changes. ENT: No sore throat. Cardiovascular: Denies chest pain. Respiratory: Denies shortness of breath. Gastrointestinal: Positive for right lower quadrant/right flank pain.  No diarrhea.  No constipation. Genitourinary: Negative for dysuria. Musculoskeletal: Negative for neck pain.  Negative for back pain. Integumentary: Negative for rash. Neurological: Negative for headaches, focal weakness or numbness.   ____________________________________________   PHYSICAL EXAM:  VITAL SIGNS: ED Triage Vitals  Enc Vitals Group     BP 01/11/19 2040 (!) 149/95     Pulse Rate 01/11/19 2040 68     Resp 01/11/19 2040 18     Temp 01/11/19 2040 97.8 F (36.6 C)     Temp Source 01/11/19 2040 Oral     SpO2 01/11/19 2040 96 %     Weight 01/11/19 2036 64.4 kg (142 lb)  Height 01/11/19 2036 1.549 m (5\' 1" )     Head Circumference --      Peak Flow --      Pain Score 01/11/19 2036 8     Pain Loc --      Pain Edu? --      Excl. in GC? --     Constitutional: Alert and oriented.  Apparent discomfort  eyes: Conjunctivae are normal. Mouth/Throat: Mucous membranes are moist. Oropharynx non-erythematous. Neck: No stridor.   Cardiovascular: Normal rate, regular rhythm. Good peripheral circulation. Grossly normal heart sounds. Respiratory: Normal respiratory effort.  No retractions. No audible wheezing. Gastrointestinal: Soft and nontender. No distention.  Musculoskeletal: No lower extremity tenderness nor edema. No gross deformities of extremities. Neurologic:  Normal speech and  language. No gross focal neurologic deficits are appreciated.  Skin:  Skin is warm, dry and intact. No rash noted. Psychiatric: Mood and affect are normal. Speech and behavior are normal.  ____________________________________________   LABS (all labs ordered are listed, but only abnormal results are displayed)  Labs Reviewed  URINALYSIS, COMPLETE (UACMP) WITH MICROSCOPIC - Abnormal; Notable for the following components:      Result Value   Color, Urine STRAW (*)    APPearance CLEAR (*)    Specific Gravity, Urine 1.001 (*)    All other components within normal limits  CBC - Abnormal; Notable for the following components:   Hemoglobin 16.0 (*)    HCT 47.5 (*)    All other components within normal limits  BASIC METABOLIC PANEL  POC URINE PREG, ED  POCT PREGNANCY, URINE     RADIOLOGY I, La Bolt N BROWN, personally viewed and evaluated these images (plain radiographs) as part of my medical decision making, as well as reviewing the written report by the radiologist.  ED MD interpretation: CT revealed a punctate nonobstructing left kidney stone without any hydronephrosis.   The ultrasound revealed no acute abnormality  Official radiology report(s): Ct Renal Stone Study  Result Date: 01/12/2019 CLINICAL DATA:  53 y/o  F; 2 weeks of right-sided flank pain. EXAM: CT ABDOMEN AND PELVIS WITHOUT CONTRAST TECHNIQUE: Multidetector CT imaging of the abdomen and pelvis was performed following the standard protocol without IV contrast. COMPARISON:  09/21/2014 CT abdomen and pelvis. FINDINGS: Lower chest: No acute abnormality. Hepatobiliary: No focal liver abnormality is seen. Status post cholecystectomy. No biliary dilatation. Pancreas: Unremarkable. No pancreatic ductal dilatation or surrounding inflammatory changes. Spleen: Normal in size without focal abnormality. Adrenals/Urinary Tract: Adrenal glands are unremarkable. Two punctate nonobstructing stones are present in the left kidney. No  hydronephrosis or ureter stone identified. Normal appearance of the bladder. Stomach/Bowel: Small hiatal hernia. Gastric bypass postsurgical changes. Appendix appears normal. No evidence of bowel wall thickening, distention, or inflammatory changes. Vascular/Lymphatic: No significant vascular findings are present. No enlarged abdominal or pelvic lymph nodes. Reproductive: Uterus and bilateral adnexa are unremarkable. Other: No abdominal wall hernia or abnormality. No abdominopelvic ascites. Musculoskeletal: No acute or significant osseous findings. IMPRESSION: 1. Punctate nonobstructing left kidney stones. No hydronephrosis or ureter stone identified. 2. Small hiatal hernia. Gastric bypass postsurgical changes. Electronically Signed   By: Mitzi HansenLance  Furusawa-Stratton M.D.   On: 01/12/2019 00:51   [place] Pelvic Complete With Transvaginal  Result Date: 01/12/2019 CLINICAL DATA:  53 y/o  F; 2 weeks of pelvic pain. EXAM: TRANSABDOMINAL AND TRANSVAGINAL ULTRASOUND OF PELVIS TECHNIQUE: Both transabdominal and transvaginal ultrasound examinations of the pelvis were performed. Transabdominal technique was performed for global imaging of the pelvis including uterus, ovaries, adnexal  regions, and pelvic cul-de-sac. It was necessary to proceed with endovaginal exam following the transabdominal exam to visualize the endometrium and ovaries. COMPARISON:  01/12/2019 CT abdomen and pelvis. FINDINGS: Uterus Measurements: 6.4 x 3.5 x 5.1 cm = volume: 59.3 mL. No fibroids or other mass visualized. Endometrium Thickness: 3.4 mm.  No focal abnormality visualized. Right ovary Measurements: 2.9 x 1.0 x 2.6 cm = volume: 4.1 mL. Normal appearance/no adnexal mass. Left ovary Measurements: 3.2 x 1.6 x 1.4 cm = volume: 3.5 mL. Normal appearance/no adnexal mass. Simple follicles measuring up to 1 cm. Other findings Trace simple free fluid in the pelvis, likely physiologic. IMPRESSION: No acute process identified.  Unremarkable pelvic ultrasound.  Electronically Signed   By: Mitzi Hansen M.D.   On: 01/12/2019 03:59    ___________ Procedures   ____________________________________________   INITIAL IMPRESSION / MDM / ASSESSMENT AND PLAN / ED COURSE  As part of my medical decision making, I reviewed the following data within the electronic MEDICAL RECORD NUMBER   53 year old female presented above-stated history and physical exam secondary to right pelvic/right flank pain.  Concern for possible kidney stone versus ovarian cysts as well as UTI.  Review of the patient's chart revealed that urine culture performed on 01/09/2019 did have greater than 100,000 colonies of E. coli and a such patient given Keflex.  In addition CT scan of the abdomen and pelvis/pelvic ultrasound revealed no clear explanation for the patient's right pelvic/flank pain  *Saralynn L Spinola was evaluated in Emergency Department on 01/12/2019 for the symptoms described in the history of present illness. She was evaluated in the context of the global COVID-19 pandemic, which necessitated consideration that the patient might be at risk for infection with the SARS-CoV-2 virus that causes COVID-19. Institutional protocols and algorithms that pertain to the evaluation of patients at risk for COVID-19 are in a state of rapid change based on information released by regulatory bodies including the CDC and federal and state organizations. These policies and algorithms were followed during the patient's care in the ED.  Some ED evaluations and interventions may be delayed as a result of limited staffing during the pandemic.*    ____________________________________________  FINAL CLINICAL IMPRESSION(S) / ED DIAGNOSES  Final diagnoses:  Pelvic pain  Right flank pain  Acute cystitis without hematuria     MEDICATIONS GIVEN DURING THIS VISIT:  Medications  cephALEXin (KEFLEX) capsule 500 mg (has no administration in time range)  ondansetron (ZOFRAN) injection 4 mg (4 mg  Intravenous Given 01/12/19 0010)  morphine 4 MG/ML injection 4 mg (4 mg Intravenous Given 01/12/19 0010)     ED Discharge Orders         Ordered    cephALEXin (KEFLEX) 500 MG capsule  2 times daily     01/12/19 0415    traMADol (ULTRAM) 50 MG tablet  Every 6 hours PRN     01/12/19 0416           Note:  This document was prepared using Dragon voice recognition software and may include unintentional dictation errors.   Darci Current, MD 01/12/19 [phone]

## 2019-02-06 ENCOUNTER — Ambulatory Visit: Payer: 59

## 2019-02-06 ENCOUNTER — Ambulatory Visit: Payer: 59 | Admitting: Obstetrics & Gynecology

## 2019-02-06 ENCOUNTER — Other Ambulatory Visit: Payer: Self-pay

## 2019-02-10 ENCOUNTER — Other Ambulatory Visit: Payer: Self-pay | Admitting: Obstetrics & Gynecology

## 2019-02-10 DIAGNOSIS — N83201 Unspecified ovarian cyst, right side: Secondary | ICD-10-CM

## 2019-02-10 DIAGNOSIS — G8929 Other chronic pain: Secondary | ICD-10-CM

## 2019-02-20 ENCOUNTER — Ambulatory Visit (INDEPENDENT_AMBULATORY_CARE_PROVIDER_SITE_OTHER): Payer: 59

## 2019-02-20 ENCOUNTER — Other Ambulatory Visit: Payer: Self-pay

## 2019-02-20 ENCOUNTER — Encounter: Payer: Self-pay | Admitting: Obstetrics & Gynecology

## 2019-02-20 ENCOUNTER — Ambulatory Visit: Payer: 59 | Admitting: Obstetrics & Gynecology

## 2019-02-20 VITALS — BP 126/74 | HR 61 | Ht 61.0 in | Wt 146.0 lb

## 2019-02-20 DIAGNOSIS — M545 Low back pain, unspecified: Secondary | ICD-10-CM

## 2019-02-20 DIAGNOSIS — N83201 Unspecified ovarian cyst, right side: Secondary | ICD-10-CM

## 2019-02-20 DIAGNOSIS — Z87410 Personal history of cervical dysplasia: Secondary | ICD-10-CM | POA: Diagnosis not present

## 2019-02-20 DIAGNOSIS — Z8742 Personal history of other diseases of the female genital tract: Secondary | ICD-10-CM | POA: Diagnosis not present

## 2019-02-20 DIAGNOSIS — G8929 Other chronic pain: Secondary | ICD-10-CM | POA: Diagnosis not present

## 2019-02-20 NOTE — Progress Notes (Signed)
  HPI: Pt had been having some right lower quadrant and back pain in the past; was noted to have an ovarian cyst in distant past.  CT/US in May 2020 revealed ovarian follicle, and small kidney stone. Also treated for UTI in May.   Pain is now resolved.    Ultrasound demonstrates no masses seen These findings are Pelvis normal  PMHx: She  has a past medical history of Calculus of kidney, Depression, Hypertension, Migraine, and Ovarian cyst. Also,  has a past surgical history that includes Bariatric Surgery; Tubal ligation; Tonsillectomy; Endometrial ablation; and Cholecystectomy., family history includes Colon cancer in her paternal grandmother; Diabetes in her father, maternal grandfather, and maternal grandmother; Esophageal cancer in her maternal grandmother; Hypertension in her father; Kidney cancer in her brother; Lung cancer in her maternal aunt, maternal grandmother, and maternal uncle; Uterine cancer in her maternal aunt.,  reports that she has never smoked. She has never used smokeless tobacco. She reports that she does not drink alcohol or use drugs.  She has a current medication list which includes the following prescription(s): clonazepam, clonidine, tramadol, and tramadol. Also, is allergic to penicillins.  Review of Systems  All other systems reviewed and are negative.   Objective: BP 126/74 (BP Location: Left Arm, Patient Position: Sitting, Cuff Size: Normal)   Pulse 61   Ht 5\' 1"  (1.549 m)   Wt 146 lb (66.2 kg)   BMI 27.59 kg/m   Physical examination Constitutional NAD, Conversant  Skin No rashes, lesions or ulceration.   Extremities: Moves all appropriately.  Normal ROM for age. No lymphadenopathy.  Neuro: Grossly intact  Psych: Oriented to PPT.  Normal mood. Normal affect.   US Pelvis Transvanginal Non-ob (tv Only)  Result Date: 02/20/2019 Patient Name: Emily Snyder DOB: 1965-12-03 MRN: [id number] ULTRASOUND REPORT Location: [facility] Date of Service:  02/20/2019 Indications:Pelvic Pain Findings: The uterus is anteverted and measures 7.6 x 5.4 x 3.6 cm. Echo texture is homogenous without evidence of focal masses. The Endometrium measures 2.6 mm. Right Ovary measures 2.6 x 0.8 x 1.0 cm. It is normal in appearance. Left Ovary measures 2.3 x 1.6 x 1.0 cm. It is normal in appearance. Survey of the adnexa demonstrates no adnexal masses. There is no free fluid in the cul de sac. Impression: 1. Normal pelvic ultrasound. Recommendations: 1.Clinical correlation with the patient's History and Physical Exam. Gweneth Dimitri, RT Review of ULTRASOUND.    I have personally reviewed images and report of recent ultrasound done at [facility].    Plan of management to be discussed with patient. Barnett Applebaum, MD, [facility], [facility] 02/20/2019  9:25 AM   Assessment:  Chronic right-sided low back pain without sciatica - Plan: improved History of ovarian cyst - resolved    Monitor for s/sx recurrence History of cervical dysplasia    PAP follow up in 2 mos    (recent LGSIL; prior normal colpo/bx)    Consider colpo if still abnormal at next PAP encounter  A total of 15 minutes were spent face-to-face with the patient during this encounter and over half of that time dealt with counseling and coordination of care.  Barnett Applebaum, MD, [facility], [facility] 02/20/2019  9:28 AM

## 2019-03-02 ENCOUNTER — Ambulatory Visit
Admission: RE | Admit: 2019-03-02 | Discharge: 2019-03-02 | Disposition: A | Discharge status: Home / Self Care | Payer: 59 | Source: Ambulatory Visit | Attending: Obstetrics & Gynecology | Admitting: Obstetrics & Gynecology

## 2019-03-02 ENCOUNTER — Other Ambulatory Visit: Payer: Self-pay

## 2019-03-02 DIAGNOSIS — Z1231 Encounter for screening mammogram for malignant neoplasm of breast: Secondary | ICD-10-CM | POA: Diagnosis not present

## 2019-03-02 DIAGNOSIS — Z1239 Encounter for other screening for malignant neoplasm of breast: Secondary | ICD-10-CM

## 2019-04-24 ENCOUNTER — Ambulatory Visit: Payer: 59 | Admitting: Obstetrics & Gynecology

## 2020-02-24 IMAGING — CT CT RENAL STONE PROTOCOL
2 of 4 series · 16 of 46 positions shown, 18 images · non-contrast
Comparison: 09/21/2014 CT abdomen and pelvis.

CLINICAL DATA: 52 y/o  F; 2 weeks of right-sided flank pain.

EXAM:
CT ABDOMEN AND PELVIS WITHOUT CONTRAST
TECHNIQUE: Multidetector CT imaging of the abdomen and pelvis was performed
following the standard protocol without IV contrast.

[Series 2: stone full standard · axial · 0.70mm/px · z∈[-916,-506]mm · 13 of 90 slices shown, 15 images]
[im 4/90  soft-tissue]
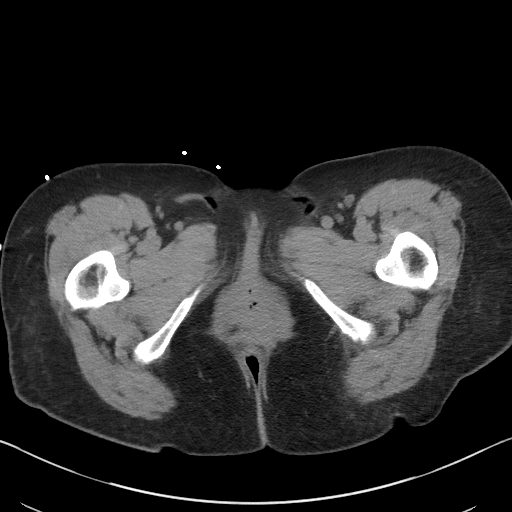
[im 4/90  bone]
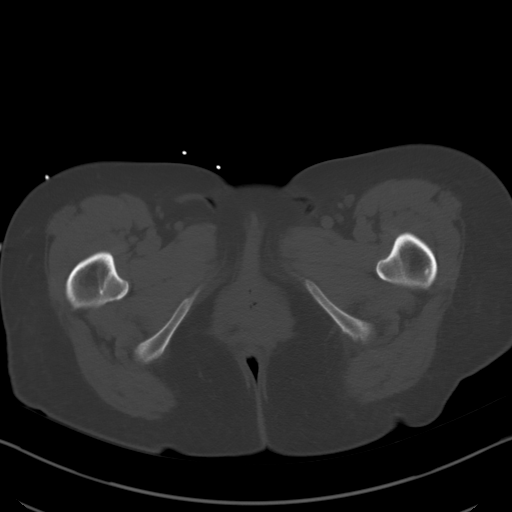
[im 11/90  soft-tissue]
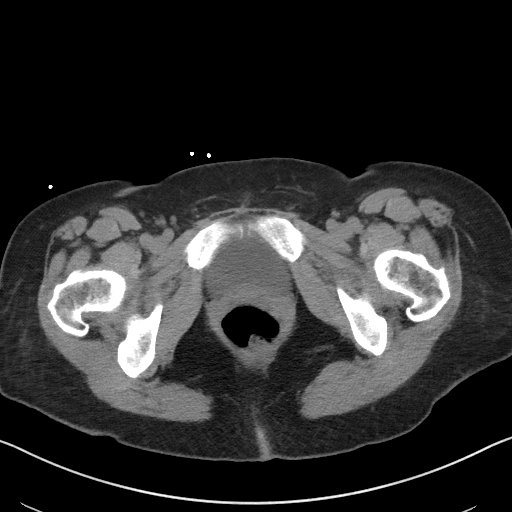
[im 18/90  soft-tissue]
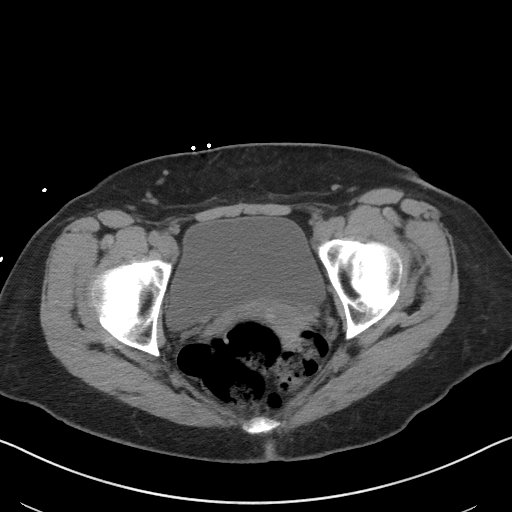
[im 25/90  soft-tissue]
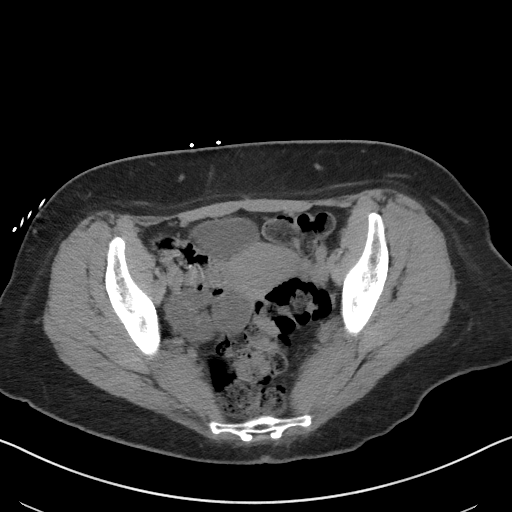
[im 33/90  soft-tissue]
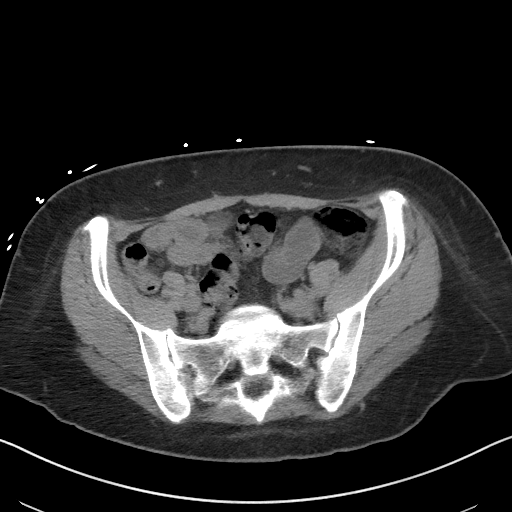
[im 40/90  soft-tissue]
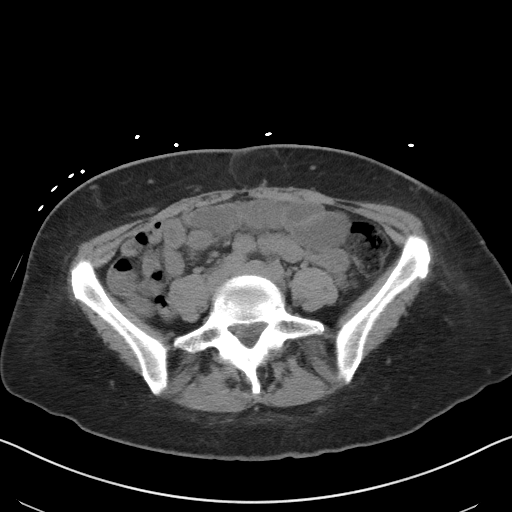
[im 47/90  soft-tissue]
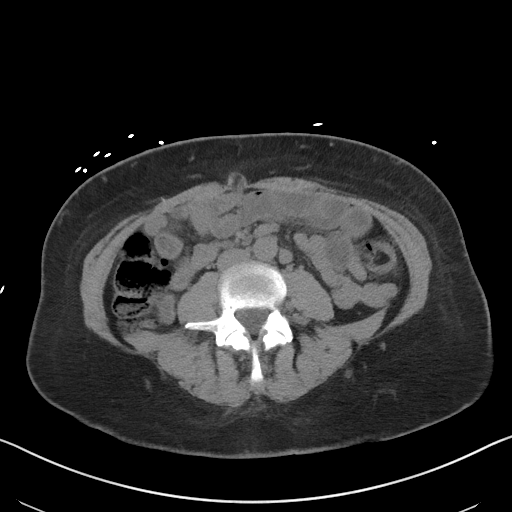
[im 50/90  soft-tissue]
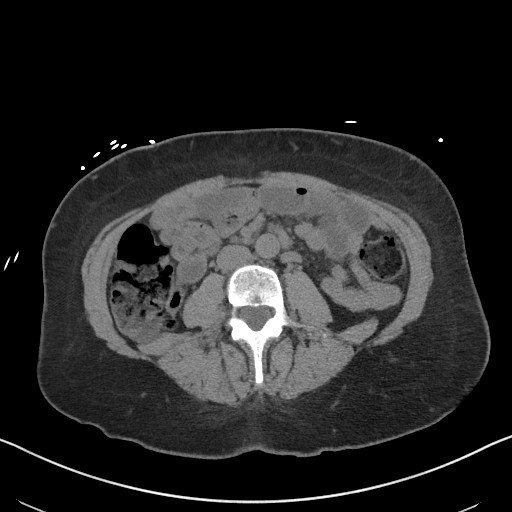
[im 57/90  soft-tissue]
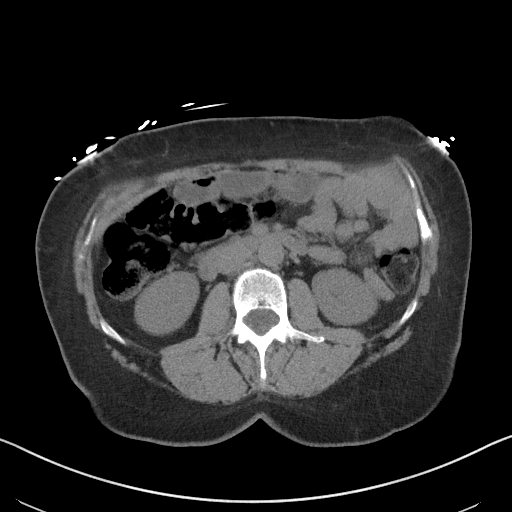
[im 57/90  bone]
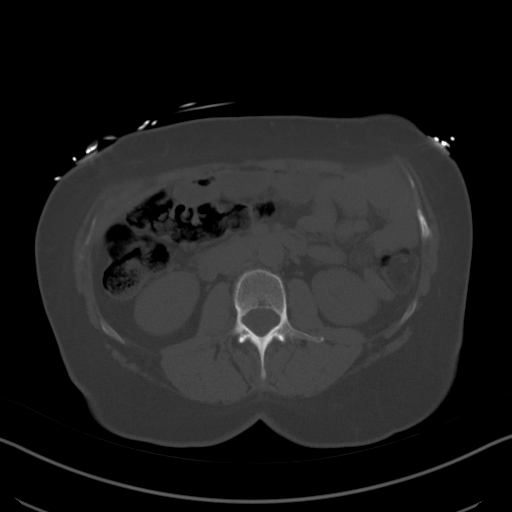
[im 65/90  soft-tissue]
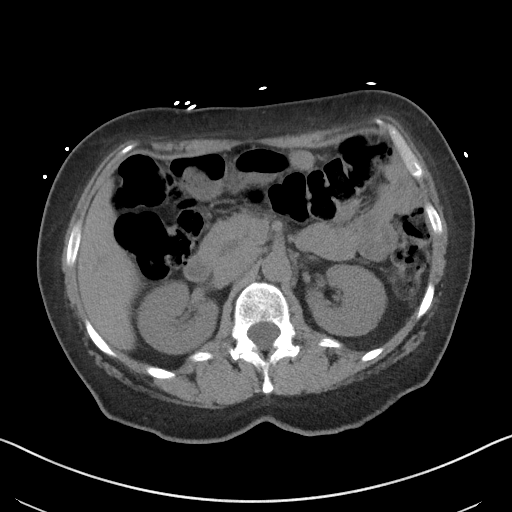
[im 72/90  soft-tissue]
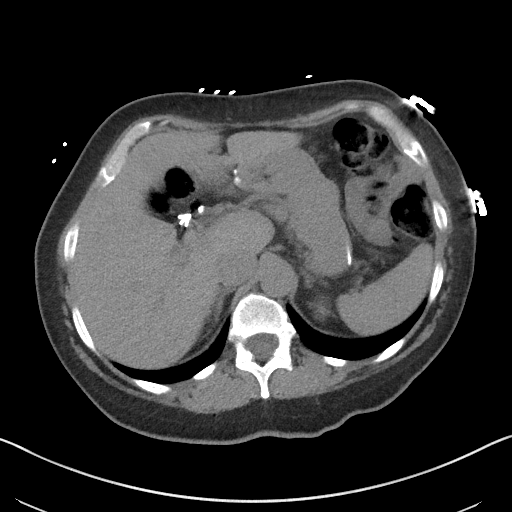
[im 79/90  soft-tissue]
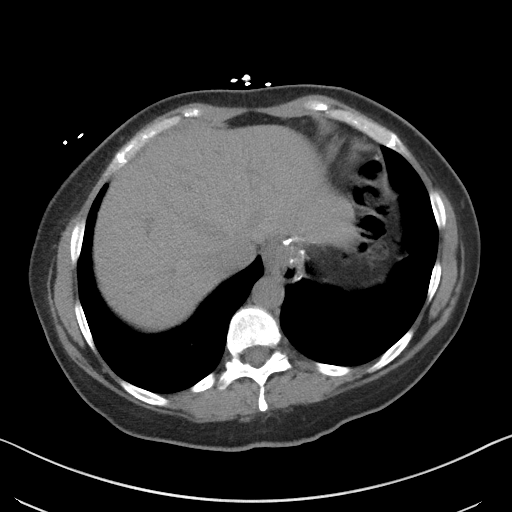
[im 86/90  soft-tissue]
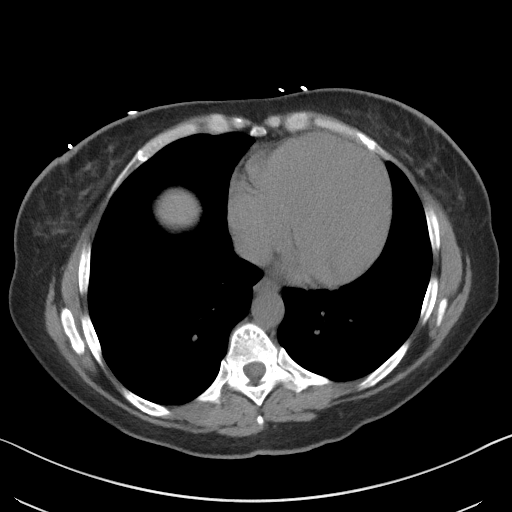

[Series 5: coronal · coronal · 0.73mm/px · 3 of 119 slices shown]
[im 40/119  soft-tissue]
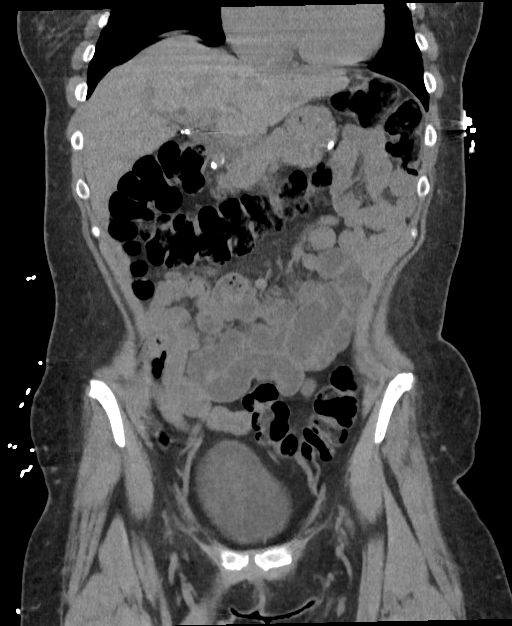
[im 53/119  soft-tissue]
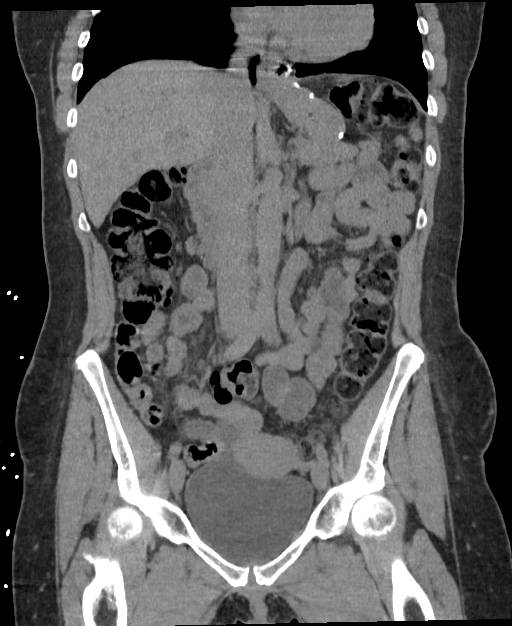
[im 66/119  soft-tissue]
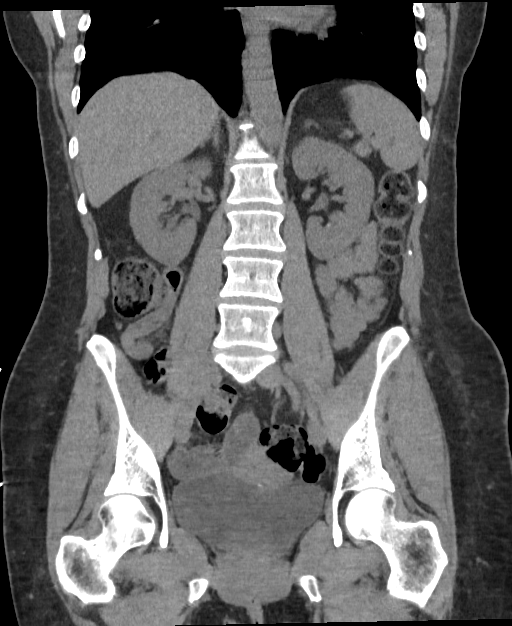

[16 of 46 positions shown; findings below may reference images not displayed]

FINDINGS: Lower chest: No acute abnormality.

Hepatobiliary: No focal liver abnormality is seen. Status post
cholecystectomy. No biliary dilatation.

Pancreas: Unremarkable. No pancreatic ductal dilatation or
surrounding inflammatory changes.

Spleen: Normal in size without focal abnormality.

Adrenals/Urinary Tract: Adrenal glands are unremarkable. Two
punctate nonobstructing stones are present in the left kidney. No
hydronephrosis or ureter stone identified. Normal appearance of the
bladder.

Stomach/Bowel: Small hiatal hernia. Gastric bypass postsurgical
changes. Appendix appears normal. No evidence of bowel wall
thickening, distention, or inflammatory changes.

Vascular/Lymphatic: No significant vascular findings are present. No
enlarged abdominal or pelvic lymph nodes.

Reproductive: Uterus and bilateral adnexa are unremarkable.

Other: No abdominal wall hernia or abnormality. No abdominopelvic
ascites.

Musculoskeletal: No acute or significant osseous findings.
IMPRESSION: 1. Punctate nonobstructing left kidney stones. No hydronephrosis or
ureter stone identified.
2. Small hiatal hernia. Gastric bypass postsurgical changes.

## 2023-04-19 LAB — EXTERNAL GENERIC LAB PROCEDURE: COLOGUARD: NEGATIVE

## 2023-04-19 LAB — COLOGUARD: COLOGUARD: NEGATIVE

## 2023-06-17 ENCOUNTER — Other Ambulatory Visit: Payer: Self-pay

## 2023-06-17 ENCOUNTER — Emergency Department
Admission: EM | Admit: 2023-06-17 | Discharge: 2023-06-17 | Disposition: A | Discharge status: Home / Self Care | Payer: 59 | Attending: Emergency Medicine | Admitting: Emergency Medicine

## 2023-06-17 ENCOUNTER — Emergency Department: Payer: 59

## 2023-06-17 DIAGNOSIS — R519 Headache, unspecified: Secondary | ICD-10-CM | POA: Diagnosis present

## 2023-06-17 DIAGNOSIS — G43001 Migraine without aura, not intractable, with status migrainosus: Secondary | ICD-10-CM | POA: Diagnosis not present

## 2023-06-17 DIAGNOSIS — I10 Essential (primary) hypertension: Secondary | ICD-10-CM | POA: Insufficient documentation

## 2023-06-17 LAB — CBC WITH DIFFERENTIAL/PLATELET
Abs Immature Granulocytes: 0.01 10*3/uL (ref 0.00–0.07)
Basophils Absolute: 0.1 10*3/uL (ref 0.0–0.1)
Basophils Relative: 1 %
Eosinophils Absolute: 0.4 10*3/uL (ref 0.0–0.5)
Eosinophils Relative: 6 %
HCT: 41 % (ref 36.0–46.0)
Hemoglobin: 13.3 g/dL (ref 12.0–15.0)
Immature Granulocytes: 0 %
Lymphocytes Relative: 28 %
Lymphs Abs: 2.1 10*3/uL (ref 0.7–4.0)
MCH: 28 pg (ref 26.0–34.0)
MCHC: 32.4 g/dL (ref 30.0–36.0)
MCV: 86.3 fL (ref 80.0–100.0)
Monocytes Absolute: 0.5 10*3/uL (ref 0.1–1.0)
Monocytes Relative: 7 %
Neutro Abs: 4.5 10*3/uL (ref 1.7–7.7)
Neutrophils Relative %: 58 %
Platelets: 305 10*3/uL (ref 150–400)
RBC: 4.75 MIL/uL (ref 3.87–5.11)
RDW: 15.8 % — ABNORMAL HIGH (ref 11.5–15.5)
WBC: 7.6 10*3/uL (ref 4.0–10.5)
nRBC: 0 % (ref 0.0–0.2)

## 2023-06-17 LAB — BASIC METABOLIC PANEL
Anion gap: 9 (ref 5–15)
BUN: 11 mg/dL (ref 6–20)
CO2: 23 mmol/L (ref 22–32)
Calcium: 8.8 mg/dL — ABNORMAL LOW (ref 8.9–10.3)
Chloride: 110 mmol/L (ref 98–111)
Creatinine, Ser: 0.69 mg/dL (ref 0.44–1.00)
GFR, Estimated: >60 mL/min (ref >60)
Glucose, Bld: 94 mg/dL (ref 70–99)
Potassium: 3.8 mmol/L (ref 3.5–5.1)
Sodium: 142 mmol/L (ref 135–145)

## 2023-06-17 LAB — TROPONIN I (HIGH SENSITIVITY): Troponin I (High Sensitivity): 7 ng/L (ref <18)

## 2023-06-17 MED ORDER — SODIUM CHLORIDE 0.9 % IV BOLUS
1000.0000 mL | Freq: Once | INTRAVENOUS | Status: AC
  Administered 2023-06-17: 1000 mL via INTRAVENOUS

## 2023-06-17 MED ORDER — IOHEXOL 350 MG/ML SOLN
75.0000 mL | Freq: Once | INTRAVENOUS | Status: AC | PRN
  Administered 2023-06-17: 75 mL via INTRAVENOUS

## 2023-06-17 MED ORDER — DEXAMETHASONE SODIUM PHOSPHATE 10 MG/ML IJ SOLN
10.0000 mg | Freq: Once | INTRAMUSCULAR | Status: AC
  Administered 2023-06-17: 10 mg via INTRAVENOUS
  Filled 2023-06-17: qty 1

## 2023-06-17 MED ORDER — METOCLOPRAMIDE HCL 5 MG/ML IJ SOLN
10.0000 mg | Freq: Once | INTRAMUSCULAR | Status: AC
  Administered 2023-06-17: 10 mg via INTRAVENOUS
  Filled 2023-06-17: qty 2

## 2023-06-17 MED ORDER — DIPHENHYDRAMINE HCL 50 MG/ML IJ SOLN
25.0000 mg | Freq: Once | INTRAMUSCULAR | Status: AC
  Administered 2023-06-17: 25 mg via INTRAVENOUS
  Filled 2023-06-17: qty 1

## 2023-06-17 NOTE — ED Triage Notes (Addendum)
Pt states 11 days ago she felt a pop in her head and began to have a migraine, pt states she has had nor relief over the past 11 days and has now developed stiffness in her neck and left shoulder. Pt states she also has some numbness to her left hand, speech clear pt ambulatory to triage. Pt has hx migraines

## 2023-06-17 NOTE — ED Provider Notes (Signed)
[facility] Provider Note    Event Date/Time   First MD Initiated Contact with Patient 06/17/23 [phone]     (approximate)   History   Headache   HPI  Emily Snyder is a 57 y.o. female   presents to the ED with complaint of left-sided headache for approximately 11 days.  Patient states that initially she had a sharp pain in the left side of her head that felt like a "pop".  Patient states that that day she had some blurred vision in her left eye which resolved.  She has taken Maxalt with the last dose being last evening without any improvement of her headaches.  Patient does have a history of migraines.  Patient also has a history of hypertension, depression and has had bariatric surgery.    Physical Exam   Triage Vital Signs: ED Triage Vitals  Encounter Vitals Group     BP 06/17/23 0646 (!) 162/116     Systolic BP Percentile --      Diastolic BP Percentile --      Pulse Rate 06/17/23 0646 64     Resp 06/17/23 0646 18     Temp 06/17/23 0646 98 F (36.7 C)     Temp src --      SpO2 06/17/23 0646 98 %     Weight 06/17/23 0645 165 lb (74.8 kg)     Height 06/17/23 0645 5\' 1"  (1.549 m)     Head Circumference --      Peak Flow --      Pain Score 06/17/23 0645 7     Pain Loc --      Pain Education --      Exclude from Growth Chart --     Most recent vital signs: Vitals:   06/17/23 0646 06/17/23 1051  BP: (!) 162/116 (!) 150/98  Pulse: 64 60  Resp: 18 18  Temp: 98 F (36.7 C) 98 F (36.7 C)  SpO2: 98% 98%     General: Awake, no distress.  Alert, talkative, no noted photosensitivity. CV:  Good peripheral perfusion.  Heart rate and rate and rhythm. Resp:  Normal effort.  Lungs are clear bilaterally. Abd:  No distention.  Other:  PERRLA, EOMI's, cranial nerves II through XII grossly intact.  Patient is able to stand and ambulate without any assistance.   ED Results / Procedures / Treatments   Labs (all labs ordered are listed, but only  abnormal results are displayed) Labs Reviewed  CBC WITH DIFFERENTIAL/PLATELET - Abnormal; Notable for the following components:      Result Value   RDW 15.8 (*)    All other components within normal limits  BASIC METABOLIC PANEL - Abnormal; Notable for the following components:   Calcium 8.8 (*)    All other components within normal limits  TROPONIN I (HIGH SENSITIVITY)  TROPONIN I (HIGH SENSITIVITY)     EKG  Vent. rate 71 BPM PR interval 148 ms QRS duration 92 ms QT/QTcB 424/460 ms P-R-T axes 44 39 23 Normal sinus rhythm with sinus arrhythmia Cannot rule out Anterior infarct , age undetermined Abnormal ECG    RADIOLOGY CT angio head and neck with and without contrast per radiologist is negative for acute changes.  Patient does have odontogenic disease left upper molar area.    PROCEDURES:  Critical Care performed:   Procedures   MEDICATIONS ORDERED IN ED: Medications  iohexol (OMNIPAQUE) 350 MG/ML injection 75 mL (75 mLs Intravenous Contrast Given  06/17/23 0748)  sodium chloride 0.9 % bolus 1,000 mL (0 mLs Intravenous Stopped 06/17/23 1050)  metoCLOPramide (REGLAN) injection 10 mg (10 mg Intravenous Given 06/17/23 0846)  dexamethasone (DECADRON) injection 10 mg (10 mg Intravenous Given 06/17/23 0846)  diphenhydrAMINE (BENADRYL) injection 25 mg (25 mg Intravenous Given 06/17/23 0846)     IMPRESSION / MDM / ASSESSMENT AND PLAN / ED COURSE  I reviewed the triage vital signs and the nursing notes.   Differential diagnosis includes, but is not limited to, cerebral aneurysm, tumor, migraine not responding to medication, CVA.  57 year old female presents to the ED with complaint of left-sided headache for the last 11 days.  CT scan was ordered and reassuring.  Patient was made aware.  Lab work was unremarkable.  Patient was given headache cocktail containing Decadron 10 mg, Benadryl 25 mg and Reglan 10 mg IV.  Patient states that her headache is completely relieved  prior to discharge.  Patient has someone coming to pick her up.  She is to follow-up with her PCP if any continued problems with headaches.      Patient's presentation is most consistent with acute presentation with potential threat to life or bodily function.  FINAL CLINICAL IMPRESSION(S) / ED DIAGNOSES   Final diagnoses:  Migraine without aura and with status migrainosus, not intractable     Rx / DC Orders   ED Discharge Orders     None        Note:  This document was prepared using Dragon voice recognition software and may include unintentional dictation errors.   Tommi Rumps, PA-C 06/17/23 1432    Corena Herter, MD 06/19/23 1406

## 2023-06-17 NOTE — ED Notes (Signed)
See triage note  Presents with headache   States headache stated about about 1 1/2 weeks ago   Now having some neck stiffness and pain is moving into shoulder Emily Snyder

## 2023-06-17 NOTE — Discharge Instructions (Addendum)
Follow-up with your primary care provider if any continued problems or concerns.  Continue to drink lots of fluids today to stay hydrated.  Continue with your regular medication including your blood pressure medication as your blood pressure was elevated when you first arrived in the triage area which can elevate when you are having pain.

## 2024-04-28 ENCOUNTER — Inpatient Hospital Stay

## 2024-04-28 ENCOUNTER — Inpatient Hospital Stay: Attending: Internal Medicine | Admitting: Internal Medicine

## 2024-04-28 ENCOUNTER — Encounter: Payer: Self-pay | Admitting: Internal Medicine

## 2024-04-28 VITALS — BP 169/74 | HR 56 | Temp 96.8°F | Resp 16 | Ht 61.0 in | Wt 171.2 lb

## 2024-04-28 DIAGNOSIS — R200 Anesthesia of skin: Secondary | ICD-10-CM | POA: Diagnosis not present

## 2024-04-28 DIAGNOSIS — Z801 Family history of malignant neoplasm of trachea, bronchus and lung: Secondary | ICD-10-CM | POA: Insufficient documentation

## 2024-04-28 DIAGNOSIS — Z803 Family history of malignant neoplasm of breast: Secondary | ICD-10-CM | POA: Diagnosis not present

## 2024-04-28 DIAGNOSIS — R202 Paresthesia of skin: Secondary | ICD-10-CM | POA: Diagnosis not present

## 2024-04-28 DIAGNOSIS — Z8051 Family history of malignant neoplasm of kidney: Secondary | ICD-10-CM | POA: Insufficient documentation

## 2024-04-28 DIAGNOSIS — Z833 Family history of diabetes mellitus: Secondary | ICD-10-CM | POA: Insufficient documentation

## 2024-04-28 DIAGNOSIS — I10 Essential (primary) hypertension: Secondary | ICD-10-CM | POA: Insufficient documentation

## 2024-04-28 DIAGNOSIS — Z8249 Family history of ischemic heart disease and other diseases of the circulatory system: Secondary | ICD-10-CM | POA: Insufficient documentation

## 2024-04-28 DIAGNOSIS — Z880 Allergy status to penicillin: Secondary | ICD-10-CM | POA: Diagnosis not present

## 2024-04-28 DIAGNOSIS — D649 Anemia, unspecified: Secondary | ICD-10-CM | POA: Insufficient documentation

## 2024-04-28 DIAGNOSIS — E611 Iron deficiency: Secondary | ICD-10-CM | POA: Insufficient documentation

## 2024-04-28 DIAGNOSIS — Z800 Family history of malignant neoplasm of digestive organs: Secondary | ICD-10-CM | POA: Insufficient documentation

## 2024-04-28 DIAGNOSIS — Z8049 Family history of malignant neoplasm of other genital organs: Secondary | ICD-10-CM | POA: Diagnosis not present

## 2024-04-28 DIAGNOSIS — Z9884 Bariatric surgery status: Secondary | ICD-10-CM | POA: Diagnosis not present

## 2024-04-28 NOTE — Assessment & Plan Note (Addendum)
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 B12- 600+; vit D 36  # Patient is quite symptomatic from her iron deficiency.  Patient complains of poor tolerance. Discussed regarding IV iron infusion/Venofer.   Discussed the potential acute infusion reactions with IV iron; which are quite rare.  Patient understands the risk; will proceed with infusions.   # Elevated Blood pressure-anxiety repeat improved.  Recommend reaching out to PCP if not resolved.  Thank you Ms.Donal for allowing me to participate in the care of your pleasant patient. Please do not hesitate to contact me with questions or concerns in the interim.  # DISPOSITION: # Blood pressure repeat # No labs today # weekly venofer x 4  # follow up 3 months-  MD; 4-5 days prior- labs- cbc/bmp;magnesium;Copper, Zinc.  - possible venofer- Dr.B

## 2024-04-28 NOTE — Progress Notes (Signed)
 Fatigue/weakness: YES Dyspena: NO  Light headedness: YES Blood in stool: NO

## 2024-04-28 NOTE — Progress Notes (Signed)
 [facility] CONSULT NOTE  Patient Care Team: Donal Channing SQUIBB, FNP as PCP - General (Family Medicine) Arloa Lamar SQUIBB, MD as Referring Physician (Obstetrics and Gynecology) Dessa Reyes ORN, MD (General Surgery) Rennie Cindy SAUNDERS, MD as Consulting Physician (Oncology)  CHIEF COMPLAINTS/PURPOSE OF CONSULTATION: ANEMIA   HEMATOLOGY HISTORY  # ANEMIA[Hb; MCV-platelets- WBC; Iron sat; ferritin;  GFR- CT/US - ;   HISTORY OF PRESENTING ILLNESS:  Emily Snyder 58 y.o.  female pleasant patient with a prior history of gastric bypass [march 2017] is been referred to us  for further evaluation of anemia.  Patient complains of on going fatigue, dizziness, PICA like symptoms. Also, complains of tingling and numbness of extremities. Feels achy all over.   Easy bruising/Gum bleeding: none  Patient previously denies having any iron infusions.  Patient not tolerant to oral iron.    Review of Systems  Constitutional:  Positive for malaise/fatigue. Negative for chills, diaphoresis, fever and weight loss.  HENT:  Negative for nosebleeds and sore throat.   Eyes:  Negative for double vision.  Respiratory:  Negative for cough, hemoptysis, sputum production, shortness of breath and wheezing.   Cardiovascular:  Negative for chest pain, palpitations, orthopnea and leg swelling.  Gastrointestinal:  Negative for abdominal pain, blood in stool, constipation, diarrhea, heartburn, melena, nausea and vomiting.  Genitourinary:  Negative for dysuria, frequency and urgency.  Musculoskeletal:  Positive for joint pain and myalgias. Negative for back pain.  Skin: Negative.  Negative for itching and rash.  Neurological:  Negative for dizziness, tingling, focal weakness, weakness and headaches.  Endo/Heme/Allergies:  Does not bruise/bleed easily.  Psychiatric/Behavioral:  Negative for depression. The patient is not nervous/anxious and does not have insomnia.      MEDICAL HISTORY:  Past  Medical History:  Diagnosis Date   Calculus of kidney    Depression    Hypertension    Migraine    Ovarian cyst     SURGICAL HISTORY: Past Surgical History:  Procedure Laterality Date   BARIATRIC SURGERY     CHOLECYSTECTOMY     ENDOMETRIAL ABLATION     TONSILLECTOMY     TUBAL LIGATION      SOCIAL HISTORY: Social History   Socioeconomic History   Marital status: Single    Spouse name: Not on file   Number of children: Not on file   Years of education: Not on file   Highest education level: Not on file  Occupational History   Not on file  Tobacco Use   Smoking status: Never   Smokeless tobacco: Never  Vaping Use   Vaping status: Never Used  Substance and Sexual Activity   Alcohol use: No   Drug use: No   Sexual activity: Never    Birth control/protection: None  Other Topics Concern   Not on file  Social History Narrative   Not on file   Social Drivers of Health   Financial Resource Strain: Not on file  Food Insecurity: No Food Insecurity (04/28/2024)   Hunger Vital Sign    Worried About Running Out of Food in the Last Year: Never true    Ran Out of Food in the Last Year: Never true  Transportation Needs: No Transportation Needs (04/28/2024)   PRAPARE - Administrator, Civil Service (Medical): No    Lack of Transportation (Non-Medical): No  Physical Activity: Not on file  Stress: Not on file  Social Connections: Not on file  Intimate Partner Violence: Not At Risk (04/28/2024)  Humiliation, Afraid, Rape, and Kick questionnaire    Fear of Current or Ex-Partner: No    Emotionally Abused: No    Physically Abused: No    Sexually Abused: No    FAMILY HISTORY: Family History  Problem Relation Age of Onset   Atrial fibrillation Mother    Diabetes Father    Hypertension Father    Heart failure Father    Kidney failure Father    Myopathy Sister    Kidney cancer Brother    Lung cancer Maternal Aunt    Uterine cancer Maternal Aunt    Breast cancer  Maternal Aunt    Lung cancer Maternal Uncle    Lung cancer Maternal Grandmother    Esophageal cancer Maternal Grandmother    Diabetes Maternal Grandmother    Diabetes Maternal Grandfather    Colon cancer Paternal Grandmother    Stomach cancer Paternal Grandmother    Healthy Sister     ALLERGIES:  is allergic to penicillins.  MEDICATIONS:  Current Outpatient Medications  Medication Sig Dispense Refill   clonazePAM (KLONOPIN) 0.5 MG tablet Take 0.5 mg by mouth daily as needed. PRN     rizatriptan (MAXALT) 10 MG tablet Take 10 mg by mouth as needed.     traMADol  (ULTRAM ) 50 MG tablet Take 50 mg by mouth daily as needed.     No current facility-administered medications for this visit.     PHYSICAL EXAMINATION:   Vitals:   04/28/24 1100 04/28/24 1151  BP: (!) 165/104 (!) 169/74  Pulse: (!) 56   Resp: 16   Temp: (!) 96.8 F (36 C)   SpO2: 100%    Filed Weights   04/28/24 1100  Weight: 171 lb 3.2 oz (77.7 kg)    Physical Exam Vitals and nursing note reviewed.  HENT:     Head: Normocephalic and atraumatic.     Mouth/Throat:     Pharynx: Oropharynx is clear.  Eyes:     Extraocular Movements: Extraocular movements intact.     Pupils: Pupils are equal, round, and reactive to light.  Cardiovascular:     Rate and Rhythm: Normal rate and regular rhythm.  Pulmonary:     Comments: Decreased breath sounds bilaterally.  Abdominal:     Palpations: Abdomen is soft.  Musculoskeletal:        General: Normal range of motion.     Cervical back: Normal range of motion.  Skin:    General: Skin is warm.  Neurological:     General: No focal deficit present.     Mental Status: She is alert and oriented to person, place, and time.  Psychiatric:        Behavior: Behavior normal.        Judgment: Judgment normal.      LABORATORY DATA:  I have reviewed the data as listed Lab Results  Component Value Date   WBC 7.6 06/17/2023   HGB 13.3 06/17/2023   HCT 41.0 06/17/2023    MCV 86.3 06/17/2023   PLT 305 06/17/2023   Recent Labs    06/17/23 0647  NA 142  K 3.8  CL 110  CO2 23  GLUCOSE 94  BUN 11  CREATININE 0.69  CALCIUM 8.8*  GFRNONAA >60     No results found.  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 B12- 600+  # Patient complains of poor tolerance. Discussed regarding IV iron  infusion/Venofer.   Discussed the potential acute infusion reactions with IV iron; which are quite rare.  Patient understands the risk; will proceed with infusions.   # gastric bypass [march 2017]- lost 120 pounds-  # Elevated Blood pressure-anxiety repeat improved.  Recommend reaching out to PCP if not resolved.  Thank you Ms.Donal for allowing me to participate in the care of your pleasant patient. Please do not hesitate to contact me with questions or concerns in the interim.  # DISPOSITION: # Blood pressure repeat # No labs today # weekly venofer x 4  # follow up 3 months-  MD; 4-5 days prior- labs- cbc/bmp;magnesium;Copper, Zinc.  - possible venofer- Dr.B    All questions were answered. The patient knows to call the clinic with any problems, questions or concerns.    Cindy JONELLE Joe, MD 04/28/2024 12:12 PM

## 2024-05-05 ENCOUNTER — Inpatient Hospital Stay

## 2024-05-05 VITALS — BP 144/85 | HR 67 | Temp 97.3°F | Resp 18

## 2024-05-05 DIAGNOSIS — D649 Anemia, unspecified: Secondary | ICD-10-CM | POA: Diagnosis not present

## 2024-05-05 DIAGNOSIS — E611 Iron deficiency: Secondary | ICD-10-CM

## 2024-05-05 MED ORDER — IRON SUCROSE 20 MG/ML IV SOLN
200.0000 mg | Freq: Once | INTRAVENOUS | Status: AC
  Administered 2024-05-05: 200 mg via INTRAVENOUS
  Filled 2024-05-05: qty 10

## 2024-05-05 NOTE — Patient Instructions (Signed)

## 2024-05-12 ENCOUNTER — Inpatient Hospital Stay

## 2024-05-12 VITALS — BP 138/93 | HR 50 | Temp 97.4°F | Resp 16

## 2024-05-12 DIAGNOSIS — D649 Anemia, unspecified: Secondary | ICD-10-CM | POA: Diagnosis not present

## 2024-05-12 DIAGNOSIS — E611 Iron deficiency: Secondary | ICD-10-CM

## 2024-05-12 MED ORDER — IRON SUCROSE 20 MG/ML IV SOLN
200.0000 mg | Freq: Once | INTRAVENOUS | Status: AC
  Administered 2024-05-12: 200 mg via INTRAVENOUS
  Filled 2024-05-12: qty 10

## 2024-05-12 NOTE — Patient Instructions (Signed)

## 2024-05-19 ENCOUNTER — Inpatient Hospital Stay

## 2024-05-19 VITALS — BP 162/103 | HR 60 | Temp 97.0°F | Resp 17

## 2024-05-19 DIAGNOSIS — D649 Anemia, unspecified: Secondary | ICD-10-CM | POA: Diagnosis not present

## 2024-05-19 DIAGNOSIS — E611 Iron deficiency: Secondary | ICD-10-CM

## 2024-05-19 MED ORDER — IRON SUCROSE 20 MG/ML IV SOLN
200.0000 mg | Freq: Once | INTRAVENOUS | Status: AC
  Administered 2024-05-19: 200 mg via INTRAVENOUS
  Filled 2024-05-19: qty 10

## 2024-05-19 MED ORDER — SODIUM CHLORIDE 0.9% FLUSH
10.0000 mL | Freq: Once | INTRAVENOUS | Status: AC | PRN
  Administered 2024-05-19: 10 mL
  Filled 2024-05-19: qty 10

## 2024-05-19 NOTE — Progress Notes (Signed)
 Patient tolerated Venofer  infusion well, no questions/concerns voiced. Monitored 30 min post transfusion. Patient stable at discharge. BP elevated, asymptomatic advised to contact PCP regarding multiple occasions of elevated numbers. AVS given.

## 2024-05-19 NOTE — Patient Instructions (Signed)

## 2024-05-26 ENCOUNTER — Inpatient Hospital Stay: Attending: Internal Medicine

## 2024-05-26 VITALS — BP 150/96

## 2024-05-26 DIAGNOSIS — E611 Iron deficiency: Secondary | ICD-10-CM | POA: Diagnosis present

## 2024-05-26 MED ORDER — IRON SUCROSE 20 MG/ML IV SOLN
200.0000 mg | Freq: Once | INTRAVENOUS | Status: AC
  Administered 2024-05-26: 200 mg via INTRAVENOUS
  Filled 2024-05-26: qty 10

## 2024-05-26 NOTE — Patient Instructions (Signed)

## 2024-07-28 ENCOUNTER — Inpatient Hospital Stay: Attending: Internal Medicine

## 2024-07-31 ENCOUNTER — Inpatient Hospital Stay: Attending: Internal Medicine

## 2024-07-31 DIAGNOSIS — E611 Iron deficiency: Secondary | ICD-10-CM | POA: Diagnosis present

## 2024-07-31 LAB — CBC WITH DIFFERENTIAL (CANCER CENTER ONLY)
Abs Immature Granulocytes: 0.03 K/uL (ref 0.00–0.07)
Basophils Absolute: 0.1 K/uL (ref 0.0–0.1)
Basophils Relative: 1 %
Eosinophils Absolute: 0.2 K/uL (ref 0.0–0.5)
Eosinophils Relative: 3 %
HCT: 47 % — ABNORMAL HIGH (ref 36.0–46.0)
Hemoglobin: 15.7 g/dL — ABNORMAL HIGH (ref 12.0–15.0)
Immature Granulocytes: 0 %
Lymphocytes Relative: 29 %
Lymphs Abs: 2.2 K/uL (ref 0.7–4.0)
MCH: 30.5 pg (ref 26.0–34.0)
MCHC: 33.4 g/dL (ref 30.0–36.0)
MCV: 91.4 fL (ref 80.0–100.0)
Monocytes Absolute: 0.6 K/uL (ref 0.1–1.0)
Monocytes Relative: 8 %
Neutro Abs: 4.5 K/uL (ref 1.7–7.7)
Neutrophils Relative %: 59 %
Platelet Count: 276 K/uL (ref 150–400)
RBC: 5.14 MIL/uL — ABNORMAL HIGH (ref 3.87–5.11)
RDW: 16.5 % — ABNORMAL HIGH (ref 11.5–15.5)
WBC Count: 7.7 K/uL (ref 4.0–10.5)
nRBC: 0 % (ref 0.0–0.2)

## 2024-07-31 LAB — BASIC METABOLIC PANEL WITH GFR
Anion gap: 10 (ref 5–15)
BUN: 17 mg/dL (ref 6–20)
CO2: 26 mmol/L (ref 22–32)
Calcium: 9.8 mg/dL (ref 8.9–10.3)
Chloride: 105 mmol/L (ref 98–111)
Creatinine, Ser: 0.79 mg/dL (ref 0.44–1.00)
GFR, Estimated: >60 mL/min (ref >60)
Glucose, Bld: 95 mg/dL (ref 70–99)
Potassium: 5 mmol/L (ref 3.5–5.1)
Sodium: 142 mmol/L (ref 135–145)

## 2024-07-31 LAB — MAGNESIUM: Magnesium: 2.3 mg/dL (ref 1.7–2.4)

## 2024-08-03 LAB — COPPER, SERUM: Copper: 131 ug/dL (ref 80–158)

## 2024-08-03 LAB — ZINC: Zinc: 49 ug/dL (ref 44–115)

## 2024-08-04 ENCOUNTER — Encounter: Payer: Self-pay | Admitting: Internal Medicine

## 2024-08-04 ENCOUNTER — Inpatient Hospital Stay: Admitting: Internal Medicine

## 2024-08-04 ENCOUNTER — Ambulatory Visit

## 2024-08-04 VITALS — BP 137/88 | HR 60 | Temp 97.2°F | Resp 16 | Ht 61.0 in | Wt 172.5 lb

## 2024-08-04 DIAGNOSIS — E611 Iron deficiency: Secondary | ICD-10-CM

## 2024-08-04 NOTE — Patient Instructions (Signed)
Recommend barimelt+ iron [over-the-counter medication/can buy online].  2 a day- under the tongue. These pills need to dissolve under the tongue/and should be absorbed into your bloodstream.

## 2024-08-04 NOTE — Progress Notes (Signed)
 [facility] CONSULT NOTE  Patient Care Team: Donal Channing SQUIBB, FNP as PCP - General (Family Medicine) Arloa Lamar SQUIBB, MD as Referring Physician (Obstetrics and Gynecology) Dessa, Reyes ORN, MD (General Surgery) Rennie Cindy SAUNDERS, MD as Consulting Physician (Oncology)  CHIEF COMPLAINTS/PURPOSE OF CONSULTATION: ANEMIA   HEMATOLOGY HISTORY  # ANEMIA[Hb; MCV-platelets- WBC; Iron  sat; ferritin;  GFR- CT/US - ;   HISTORY OF PRESENTING ILLNESS:  Emily Snyder 58 y.o.  female pleasant patient with a prior history of gastric bypass [march 2017] with iron  deficiency is here for follow-up.  Discussed the use of AI scribe software for clinical note transcription with the patient, who gave verbal consent to proceed.  History of Present Illness   Emily Snyder is a 58 year old female with iron  deficiency secondary to gastric bypass who presents for follow-up after recent iron  infusions.  She has a history of iron  deficiency following gastric bypass surgery in March 2017. She recently completed a series of iron  infusions. The initial infusion was associated with significant malaise described as a 'twelve hour flu,' which resolved by the next day; subsequent infusions were well tolerated. Her hemoglobin increased from 13 to 15.7 after the iron  infusions.  Despite hematologic improvement, she reports persistent fatigue without subjective improvement in energy or well-being, stating she 'never felt any like, oh, I feel so much better now.' She denies symptoms of sleep apnea and reports a negative sleep study prior to gastric bypass. She sleeps well once asleep and does not have difficulty maintaining sleep. 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  She has a history of migraines, managed with as-needed medication, sometimes going a month without use. She is not on any daily medications that  would contribute to her symptoms.  She expresses concern regarding elevated blood pressure readings during iron  infusion appointments, noting that her blood pressure is otherwise normal during clinic visits. She inquires about the need for antihypertensive therapy.      Review of Systems  Constitutional:  Positive for malaise/fatigue. Negative for chills, diaphoresis, fever and weight loss.  HENT:  Negative for nosebleeds and sore throat.   Eyes:  Negative for double vision.  Respiratory:  Negative for cough, hemoptysis, sputum production, shortness of breath and wheezing.   Cardiovascular:  Negative for chest pain, palpitations, orthopnea and leg swelling.  Gastrointestinal:  Negative for abdominal pain, blood in stool, constipation, diarrhea, heartburn, melena, nausea and vomiting.  Genitourinary:  Negative for dysuria, frequency and urgency.  Musculoskeletal:  Positive for joint pain and myalgias. Negative for back pain.  Skin: Negative.  Negative for itching and rash.  Neurological:  Negative for dizziness, tingling, focal weakness, weakness and headaches.  Endo/Heme/Allergies:  Does not bruise/bleed easily.  Psychiatric/Behavioral:  Negative for depression. The patient is not nervous/anxious and does not have insomnia.      MEDICAL HISTORY:  Past Medical History:  Diagnosis Date   Calculus of kidney    Depression    Hypertension    Migraine    Ovarian cyst     SURGICAL HISTORY: Past Surgical History:  Procedure Laterality Date   BARIATRIC SURGERY     CHOLECYSTECTOMY     ENDOMETRIAL ABLATION     TONSILLECTOMY     TUBAL LIGATION      SOCIAL HISTORY: Social History   Socioeconomic History   Marital status: Single    Spouse name: Not on file   Number of children:  Not on file   Years of education: Not on file   Highest education level: Not on file  Occupational History   Not on file  Tobacco Use   Smoking status: Never   Smokeless tobacco: Never  Vaping Use    Vaping status: Never Used  Substance and Sexual Activity   Alcohol use: No   Drug use: No   Sexual activity: Never    Birth control/protection: None  Other Topics Concern   Not on file  Social History Narrative   Not on file   Social Drivers of Health   Tobacco Use: Low Risk (08/04/2024)   Patient History    Smoking Tobacco Use: Never    Smokeless Tobacco Use: Never    Passive Exposure: Not on file  Financial Resource Strain: Not on file  Food Insecurity: No Food Insecurity (04/28/2024)   Epic    Worried About Programme Researcher, Broadcasting/film/video in the Last Year: Never true    Ran Out of Food in the Last Year: Never true  Transportation Needs: No Transportation Needs (04/28/2024)   Epic    Lack of Transportation (Medical): No    Lack of Transportation (Non-Medical): No  Physical Activity: Not on file  Stress: Not on file  Social Connections: Not on file  Intimate Partner Violence: Not At Risk (04/28/2024)   Epic    Fear of Current or Ex-Partner: No    Emotionally Abused: No    Physically Abused: No    Sexually Abused: No  Depression (PHQ2-9): Low Risk (08/04/2024)   Depression (PHQ2-9)    PHQ-2 Score: 3  Alcohol Screen: Not on file  Housing: Low Risk (04/28/2024)   Epic    Unable to Pay for Housing in the Last Year: No    Number of Times Moved in the Last Year: 0    Homeless in the Last Year: No  Utilities: Not At Risk (04/28/2024)   Epic    Threatened with loss of utilities: No  Health Literacy: Not on file    FAMILY HISTORY: Family History  Problem Relation Age of Onset   Atrial fibrillation Mother    Diabetes Father    Hypertension Father    Heart failure Father    Kidney failure Father    Myopathy Sister    Kidney cancer Brother    Lung cancer Maternal Aunt    Uterine cancer Maternal Aunt    Breast cancer Maternal Aunt    Lung cancer Maternal Uncle    Lung cancer Maternal Grandmother    Esophageal cancer Maternal Grandmother    Diabetes Maternal Grandmother    Diabetes  Maternal Grandfather    Colon cancer Paternal Grandmother    Stomach cancer Paternal Grandmother    Healthy Sister     ALLERGIES:  is allergic to penicillins.  MEDICATIONS:  Current Outpatient Medications  Medication Sig Dispense Refill   clonazePAM (KLONOPIN) 0.5 MG tablet Take 0.5 mg by mouth daily as needed. PRN     cyanocobalamin (VITAMIN B12) 1000 MCG/ML injection Inject 1,000 mcg into the muscle every 30 (thirty) days.     folic acid (FOLVITE) 1 MG tablet Take 1 mg by mouth daily.     rizatriptan (MAXALT) 10 MG tablet Take 10 mg by mouth as needed.     traMADol  (ULTRAM ) 50 MG tablet Take 50 mg by mouth daily as needed.     No current facility-administered medications for this visit.     PHYSICAL EXAMINATION:   Vitals:  08/04/24 1447 08/04/24 1524  BP: (!) 163/96 137/88  Pulse:    Resp:    Temp:    SpO2:     Filed Weights   08/04/24 1438  Weight: 172 lb 8 oz (78.2 kg)    Physical Exam Vitals and nursing note reviewed.  HENT:     Head: Normocephalic and atraumatic.     Mouth/Throat:     Pharynx: Oropharynx is clear.  Eyes:     Extraocular Movements: Extraocular movements intact.     Pupils: Pupils are equal, round, and reactive to light.  Cardiovascular:     Rate and Rhythm: Normal rate and regular rhythm.  Pulmonary:     Comments: Decreased breath sounds bilaterally.  Abdominal:     Palpations: Abdomen is soft.  Musculoskeletal:        General: Normal range of motion.     Cervical back: Normal range of motion.  Skin:    General: Skin is warm.  Neurological:     General: No focal deficit present.     Mental Status: She is alert and oriented to person, place, and time.  Psychiatric:        Behavior: Behavior normal.        Judgment: Judgment normal.      LABORATORY DATA:  I have reviewed the data as listed Lab Results  Component Value Date   WBC 7.7 07/31/2024   HGB 15.7 (H) 07/31/2024   HCT 47.0 (H) 07/31/2024   MCV 91.4 07/31/2024   PLT  276 07/31/2024   Recent Labs    07/31/24 1536  NA 142  K 5.0  CL 105  CO2 26  GLUCOSE 95  BUN 17  CREATININE 0.79  CALCIUM 9.8  GFRNONAA >60     No results found.  ASSESSMENT & PLAN:   Iron  deficiency # Iron  deficiency secondary gastric bypass-NO colonoscopy- NEG cologard- JULY 2025- PCP- Hemoglobin 13 Iron  sat- 15% ferritin-7. B12- 600+; vit D 36. S/p Venofer -x4.  Improved/in fact higher.  Hold any further infusion at this time.  # Given history of gastric bypass - recommend barimelt+ iron   # Elevated Blood pressure-anxiety repeat improved.  Recommend reaching out to PCP if not resolved-question whitecoat syndrome  # Fatigue: no regular medications noted.  Question situational/work as per patient.  # DISPOSITION: # repeat BP # HOLD venofer  # follow up 6  months-  MD; 4-5 days prior- labs- cbc/bmp; iron  studies; ferritin-  possible venofer - Dr.B    All questions were answered. The patient knows to call the clinic with any problems, questions or concerns.    Cindy JONELLE Joe, MD 08/04/2024 3:48 PM

## 2024-08-04 NOTE — Assessment & Plan Note (Addendum)
#   Iron  deficiency secondary gastric bypass-NO colonoscopy- NEG cologard- JULY 2025- PCP- Hemoglobin 13 Iron  sat- 15% ferritin-7. B12- 600+; vit D 36. S/p Venofer -x4.  Improved/in fact higher.  Hold any further infusion at this time.  # Given history of gastric bypass - recommend barimelt+ iron   # Elevated Blood pressure-anxiety repeat improved.  Recommend reaching out to PCP if not resolved-question whitecoat syndrome  # Fatigue: no regular medications noted.  Question situational/work as per patient.  # DISPOSITION: # repeat BP # HOLD venofer  # follow up 6  months-  MD; 4-5 days prior- labs- cbc/bmp; iron  studies; ferritin-  possible venofer - Dr.B

## 2024-08-04 NOTE — Progress Notes (Signed)
 Fatigue/weakness: YES Dyspena: NO  Light headedness: NO Blood in stool: NO

## 2025-01-29 ENCOUNTER — Inpatient Hospital Stay

## 2025-02-02 ENCOUNTER — Inpatient Hospital Stay

## 2025-02-02 ENCOUNTER — Inpatient Hospital Stay: Admitting: Internal Medicine
